# Patient Record
Sex: Male | Born: 1955 | Race: White | Hispanic: No | Marital: Married | Smoking: Never smoker
Health system: Southern US, Community
[De-identification: ages and names within clinical notes are randomized; demographics above are authoritative.]

## PROBLEM LIST (undated history)

## (undated) DIAGNOSIS — H269 Unspecified cataract: Secondary | ICD-10-CM

## (undated) DIAGNOSIS — E079 Disorder of thyroid, unspecified: Secondary | ICD-10-CM

## (undated) DIAGNOSIS — K5790 Diverticulosis of intestine, part unspecified, without perforation or abscess without bleeding: Secondary | ICD-10-CM

## (undated) DIAGNOSIS — E785 Hyperlipidemia, unspecified: Secondary | ICD-10-CM

## (undated) DIAGNOSIS — G4730 Sleep apnea, unspecified: Secondary | ICD-10-CM

## (undated) DIAGNOSIS — I10 Essential (primary) hypertension: Secondary | ICD-10-CM

## (undated) DIAGNOSIS — K589 Irritable bowel syndrome without diarrhea: Secondary | ICD-10-CM

## (undated) DIAGNOSIS — M1990 Unspecified osteoarthritis, unspecified site: Secondary | ICD-10-CM

## (undated) DIAGNOSIS — G709 Myoneural disorder, unspecified: Secondary | ICD-10-CM

## (undated) DIAGNOSIS — Z800 Family history of malignant neoplasm of digestive organs: Secondary | ICD-10-CM

## (undated) DIAGNOSIS — K219 Gastro-esophageal reflux disease without esophagitis: Secondary | ICD-10-CM

## (undated) DIAGNOSIS — E669 Obesity, unspecified: Secondary | ICD-10-CM

## (undated) DIAGNOSIS — T7840XA Allergy, unspecified, initial encounter: Secondary | ICD-10-CM

## (undated) DIAGNOSIS — E039 Hypothyroidism, unspecified: Secondary | ICD-10-CM

## (undated) DIAGNOSIS — Z86010 Personal history of colonic polyps: Secondary | ICD-10-CM

## (undated) DIAGNOSIS — K5730 Diverticulosis of large intestine without perforation or abscess without bleeding: Secondary | ICD-10-CM

## (undated) HISTORY — DX: Personal history of colonic polyps: Z86.010

## (undated) HISTORY — DX: Allergy, unspecified, initial encounter: T78.40XA

## (undated) HISTORY — DX: Gastro-esophageal reflux disease without esophagitis: K21.9

## (undated) HISTORY — DX: Obesity, unspecified: E66.9

## (undated) HISTORY — PX: CATARACT EXTRACTION: SUR2

## (undated) HISTORY — DX: Hyperlipidemia, unspecified: E78.5

## (undated) HISTORY — DX: Disorder of thyroid, unspecified: E07.9

## (undated) HISTORY — DX: Myoneural disorder, unspecified: G70.9

## (undated) HISTORY — DX: Diverticulosis of large intestine without perforation or abscess without bleeding: K57.30

## (undated) HISTORY — DX: Diverticulosis of intestine, part unspecified, without perforation or abscess without bleeding: K57.90

## (undated) HISTORY — DX: Essential (primary) hypertension: I10

## (undated) HISTORY — DX: Unspecified cataract: H26.9

## (undated) HISTORY — PX: REPLACEMENT TOTAL KNEE: SUR1224

## (undated) HISTORY — PX: HEMORROIDECTOMY: SUR656

## (undated) HISTORY — PX: SIGMOID RESECTION / RECTOPEXY: SUR1294

## (undated) HISTORY — DX: Unspecified osteoarthritis, unspecified site: M19.90

## (undated) HISTORY — DX: Family history of malignant neoplasm of digestive organs: Z80.0

## (undated) HISTORY — DX: Sleep apnea, unspecified: G47.30

---

## 1998-05-15 ENCOUNTER — Ambulatory Visit (HOSPITAL_COMMUNITY): Admission: RE | Admit: 1998-05-15 | Discharge: 1998-05-15 | Payer: Self-pay | Admitting: Cardiology

## 1998-08-25 ENCOUNTER — Ambulatory Visit (HOSPITAL_COMMUNITY): Admission: RE | Admit: 1998-08-25 | Discharge: 1998-08-25 | Payer: Self-pay | Admitting: Gastroenterology

## 1998-10-01 ENCOUNTER — Ambulatory Visit (HOSPITAL_COMMUNITY): Admission: RE | Admit: 1998-10-01 | Discharge: 1998-10-01 | Payer: Self-pay | Admitting: Gastroenterology

## 1998-10-01 ENCOUNTER — Encounter: Payer: Self-pay | Admitting: Gastroenterology

## 1998-12-06 HISTORY — PX: CHOLECYSTECTOMY: SHX55

## 1999-12-14 ENCOUNTER — Other Ambulatory Visit: Admission: RE | Admit: 1999-12-14 | Discharge: 1999-12-14 | Payer: Self-pay | Admitting: Gastroenterology

## 2002-04-21 ENCOUNTER — Emergency Department (HOSPITAL_COMMUNITY): Admission: EM | Admit: 2002-04-21 | Discharge: 2002-04-21 | Payer: Self-pay | Admitting: Emergency Medicine

## 2002-04-21 ENCOUNTER — Encounter: Payer: Self-pay | Admitting: Emergency Medicine

## 2002-04-27 ENCOUNTER — Encounter: Payer: Self-pay | Admitting: General Surgery

## 2002-05-04 ENCOUNTER — Encounter (INDEPENDENT_AMBULATORY_CARE_PROVIDER_SITE_OTHER): Payer: Self-pay | Admitting: Specialist

## 2002-05-04 ENCOUNTER — Encounter: Payer: Self-pay | Admitting: General Surgery

## 2002-05-04 ENCOUNTER — Observation Stay (HOSPITAL_COMMUNITY): Admission: RE | Admit: 2002-05-04 | Discharge: 2002-05-05 | Payer: Self-pay | Admitting: General Surgery

## 2003-12-07 HISTORY — PX: NASAL SINUS SURGERY: SHX719

## 2005-03-01 ENCOUNTER — Encounter: Admission: RE | Admit: 2005-03-01 | Discharge: 2005-03-01 | Payer: Self-pay | Admitting: Pediatrics

## 2009-01-15 ENCOUNTER — Encounter (INDEPENDENT_AMBULATORY_CARE_PROVIDER_SITE_OTHER): Payer: Self-pay | Admitting: *Deleted

## 2010-04-08 ENCOUNTER — Telehealth: Payer: Self-pay | Admitting: Gastroenterology

## 2010-06-22 ENCOUNTER — Encounter (INDEPENDENT_AMBULATORY_CARE_PROVIDER_SITE_OTHER): Payer: Self-pay | Admitting: *Deleted

## 2010-06-24 ENCOUNTER — Ambulatory Visit: Payer: Self-pay | Admitting: Gastroenterology

## 2010-06-29 ENCOUNTER — Ambulatory Visit: Payer: Self-pay | Admitting: Gastroenterology

## 2010-06-29 DIAGNOSIS — Z860100 Personal history of colon polyps, unspecified: Secondary | ICD-10-CM

## 2010-06-29 HISTORY — DX: Personal history of colon polyps, unspecified: Z86.0100

## 2010-06-29 HISTORY — DX: Personal history of colonic polyps: Z86.010

## 2010-07-01 ENCOUNTER — Encounter: Payer: Self-pay | Admitting: Gastroenterology

## 2011-01-05 NOTE — Letter (Signed)
Summary: [facility] Instructions  [facility]  [address] [place], [place] [address]   Phone: [phone]  Fax: [phone]       [name] [name]    1956-02-10    MRN: [id number]        Procedure Day /Date:  Monday 06/29/2010     Arrival Time: 7:30 am      Procedure Time:  8:30 am     Location of Procedure:                    _ x_  [facility] (4th Floor)                        PREPARATION FOR COLONOSCOPY WITH MOVIPREP   Starting 5 days prior to your procedure Wednesday 7/20 do not eat nuts, seeds, popcorn, corn, beans, peas,  salads, or any raw vegetables.  Do not take any fiber supplements (e.g. Metamucil, Citrucel, and Benefiber).  THE DAY BEFORE YOUR PROCEDURE         DATE: Sunday 7/24  1.  Drink clear liquids the entire day-NO SOLID FOOD  2.  Do not drink anything colored red or purple.  Avoid juices with pulp.  No orange juice.  3.  Drink at least 64 oz. (8 glasses) of fluid/clear liquids during the day to prevent dehydration and help the prep work efficiently.  CLEAR LIQUIDS INCLUDE: Water Jello Ice Popsicles Tea (sugar ok, no milk/cream) Powdered fruit flavored drinks Coffee (sugar ok, no milk/cream) Gatorade Juice: apple, white grape, white cranberry  Lemonade Clear bullion, consomm, broth Carbonated beverages (any kind) Strained chicken noodle soup Hard Candy                             4.  In the morning, mix first dose of MoviPrep solution:    Empty 1 Pouch A and 1 Pouch B into the disposable container    Add lukewarm drinking water to the top line of the container. Mix to dissolve    Refrigerate (mixed solution should be used within 24 hrs)  5.  Begin drinking the prep at 5:00 p.m. The MoviPrep container is divided by 4 marks.   Every 15 minutes drink the solution down to the next mark (approximately 8 oz) until the full liter is complete.   6.  Follow completed prep with 16 oz of clear liquid of your choice  (Nothing red or purple).  Continue to drink clear liquids until bedtime.  7.  Before going to bed, mix second dose of MoviPrep solution:    Empty 1 Pouch A and 1 Pouch B into the disposable container    Add lukewarm drinking water to the top line of the container. Mix to dissolve    Refrigerate  THE DAY OF YOUR PROCEDURE      DATE: Monday 7/25  Beginning at 3:30 a.m. (5 hours before procedure):         1. Every 15 minutes, drink the solution down to the next mark (approx 8 oz) until the full liter is complete.  2. Follow completed prep with 16 oz. of clear liquid of your choice.    3. You may drink clear liquids until 6:30 am (2 HOURS BEFORE PROCEDURE).   MEDICATION INSTRUCTIONS  Unless otherwise instructed, you should take regular prescription medications with a small sip of water   as early as possible the morning  of your procedure.   Additional medication instructions: Do not takeTriametrene/HCTZ day of procedure.         OTHER INSTRUCTIONS  You will need a responsible adult at least 57 years of age to accompany you and drive you home.   This person must remain in the waiting room during your procedure.  Wear loose fitting clothing that is easily removed.  Leave jewelry and other valuables at home.  However, you may wish to bring a book to read or  an iPod/MP3 player to listen to music as you wait for your procedure to start.  Remove all body piercing jewelry and leave at home.  Total time from sign-in until discharge is approximately 2-3 hours.  You should go home directly after your procedure and rest.  You can resume normal activities the  day after your procedure.  The day of your procedure you should not:   Drive   Make legal decisions   Operate machinery   Drink alcohol   Return to work  You will receive specific instructions about eating, activities and medications before you leave.    The above instructions have been reviewed and explained  to me by   Ezra Sites RN  June 24, 2010 5:03 PM    I fully understand and can verbalize these instructions _____________________________ Date _________

## 2011-01-05 NOTE — Letter (Signed)
Summary: Patient Notice- Polyp Results  [facility]  [address] [place], [place] [address]   Phone: [phone]  Fax: [phone]        July 01, 2010 MRN: [id number]    [name] [name] [address] [place], [place]  [address]    Dear [name],  I am pleased to inform you that the colon polyp(s) removed during your recent colonoscopy was (were) found to be benign (no cancer detected) upon pathologic examination.  I recommend you have a repeat colonoscopy examination in 5_ years to look for recurrent polyps, as having colon polyps increases your risk for having recurrent polyps or even colon cancer in the future.  Should you develop new or worsening symptoms of abdominal pain, bowel habit changes or bleeding from the rectum or bowels, please schedule an evaluation with either your primary care physician or with me.  Additional information/recommendations:  _x_ No further action with gastroenterology is needed at this time. Please      follow-up with your primary care physician for your other healthcare      needs.  __ Please call [phone] to schedule a return visit to review your      situation.  __ Please keep your follow-up visit as already scheduled.  __ Continue treatment plan as outlined the day of your exam.  Please call us if you are having persistent problems or have questions about your condition that have not been fully answered at this time.  Sincerely,  Mardella Layman MD [facility]  This letter has been electronically signed by your physician.  Appended Document: Patient Notice- Polyp Results letter mailed

## 2011-01-05 NOTE — Procedures (Signed)
Summary: Colonoscopy  Patient: [name] [name] Note: All result statuses are Final unless otherwise noted.  Tests: (1) Colonoscopy (COL)   COL Colonoscopy           DONE     [facility]     [address]     [place], [place]  [address]           COLONOSCOPY PROCEDURE REPORT           PATIENT:  [name] [name], [name] [name]  MR#:  [id number]     BIRTHDATE:  [birth year], 54 yrs. old  GENDER:  male     ENDOSCOPIST:  Vania Rea. Jarold Motto, MD, [facility]     REF. BY:     PROCEDURE DATE:  06/29/2010     PROCEDURE:  Colonoscopy with snare polypectomy     ASA CLASS:  Class II     INDICATIONS:  history of pre-cancerous (adenomatous) colon polyps,     family history of colon cancer     MEDICATIONS:   Fentanyl 25 mcg IV, Versed 5 mg IV           DESCRIPTION OF PROCEDURE:   After the risks benefits and     alternatives of the procedure were thoroughly explained, informed     consent was obtained.  Digital rectal exam was performed and     revealed no abnormalities.   The LB CF-H180AL [id number] endoscope     was introduced through the anus and advanced to the cecum, which     was identified by both the appendix and ileocecal valve, without     limitations.  The quality of the prep was excellent, using     MoviPrep.  The instrument was then slowly withdrawn as the colon     was fully examined.     <<PROCEDUREIMAGES>>           FINDINGS:  There was a surgical anastomosis. prior sigmoid     resection.see pictures  Severe diverticulosis was found in the     left colon.  There were multiple polyps identified and removed. in     the rectum and sigmoid colon. flai 2-6 mm polyps hot snare     excised.  This was otherwise a normal examination of the colon.     Retroflexed views in the rectum revealed no abnormalities.    The     scope was then withdrawn from the patient and the procedure     completed.           COMPLICATIONS:  None     ENDOSCOPIC IMPRESSION:     1) Anastomosis     2) Severe diverticulosis  in the left colon     3) Polyps, multiple in the rectum and sigmoid colon     4) Otherwise normal examination     RECOMMENDATIONS:     1) high fiber diet     REPEAT EXAM:  No           ______________________________     Vania Rea. Jarold Motto, MD, [facility]           CC:           n.     eSIGNED:   Vania Rea. Patterson at 06/29/2010 09:05 AM           Regan Rakers, [phone]  Note: An exclamation mark (!) indicates a result that was not dispersed into the flowsheet. Document Creation Date: 06/29/2010 9:05 AM _______________________________________________________________________  (1) Order result  status: Final Collection or observation date-time: 06/29/2010 08:57 Requested date-time:  Receipt date-time:  Reported date-time:  Referring Physician:   Ordering Physician: Sheryn Bison [phone]) Specimen Source:  Source: Launa Grill Order Number: [id number] Lab site:   Appended Document: Colonoscopy     Procedures Next Due Date:    Colonoscopy: 06/2015

## 2011-01-05 NOTE — Progress Notes (Signed)
Summary: Pt Relocated  Phone Note Outgoing Call Call back at [facility] Phone [phone]   Call placed by: Harlow Mares CMA Duncan Dull),  Apr 08, 2010 9:09 AM Call placed to: Patient Summary of Call: patient is due for his colonoscopy and he is aware he has moved and will get his new GI MD to do his colonoscopy Initial call taken by: Harlow Mares CMA Duncan Dull),  Apr 08, 2010 9:11 AM     Appended Document: Pt Relocated pt never moved and never seen another GI MD

## 2011-01-05 NOTE — Miscellaneous (Signed)
Summary: [facility]  Clinical Lists Changes  Medications: Added new medication of MOVIPREP 100 GM  SOLR (PEG-KCL-NACL-NASULF-NA ASC-C) As per prep instructions. - Signed Rx of MOVIPREP 100 GM  SOLR (PEG-KCL-NACL-NASULF-NA ASC-C) As per prep instructions.;  #1 x 0;  Signed;  Entered by: Ezra Sites RN;  Authorized by: Mardella Layman MD [facility];  Method used: [facility]  [phone] [place], [place], [place]  [address], Ph: [phone] or [phone], Fax: [phone] Allergies: Added new allergy or adverse reaction of DOXYCYCLINE Observations: Added new observation of NKA: F (06/24/2010 16:17)    Prescriptions: MOVIPREP 100 GM  SOLR (PEG-KCL-NACL-NASULF-NA ASC-C) As per prep instructions.  #1 x 0   Entered by:   Ezra Sites RN   Authorized by:   Mardella Layman MD [facility]   Signed by:   Ezra Sites RN on 06/24/2010   Method used:   [facility]  [address] [phone][facility] ([facility]ail)       [address]       [place], [place]  [address]       Ph: [phone] or [phone]       Fax: [phone]   RxID:   [id number]

## 2011-04-23 NOTE — Op Note (Signed)
[facility]  Patient:    [name] [name], [name] [name] Visit Number: [id number] MRN: [id number]          Service Type: SUR Location: 4W [id number] 02 Attending Physician:  Tempie Donning Dictated by:   Gita Kudo, M.D. Proc. Date: 05/04/02 Admit Date:  05/04/2002                             Operative Report  OPERATIVE PROCEDURE:  Laparoscopic cholecystectomy with intraoperative cholangiogram.  SURGEON:  Gita Kudo, M.D.  ANESTHESIA:  General endotracheal.  PREOPERATIVE DIAGNOSIS:  Symptomatic gallstones.  POSTOPERATIVE DIAGNOSIS:  Symptomatic gallstones - cholecystitis, normal-appearing cholangiogram.  CLINICAL SUMMARY:  A 55 year old male with symptomatic gallstones presented with abdominal pain, and ultrasound revealed multiple stones.  His liver function studies are normal.  OPERATIVE FINDINGS:  The patient had multiple adhesions in his lower abdomen from his previous sigmoid resection for diverticular disease many years ago by myself.  His gallbladder was thin-walled and small and had multiple small stones.  The cystic duct looked somewhat tortuous on the cholangiogram, and the common duct was long and emptied easily into the duodenum.  OPERATIVE PROCEDURE:  Under satisfactory general endotracheal anesthesia, having received 1.0 g Ancef preop, the patients abdomen was prepped and draped in a standard fashion.  A transverse incision made above the umbilicus and the midline opened into the peritoneum.  Controlled with a figure-of-eight 0 Vicryl suture and operating Hasson port inserted and good CO2 pneumoperitoneum established.  Then the camera was placed and, under direct vision, through Marcaine-infiltrated skin incisions, two #5 ports placed laterally and a second #10 port medially.  Operating through the medial port, with graspers in the lateral port giving good exposure, I dissected the cystic duct gallbladder junction.  Actually at  the junction, the proximal cystic duct tore, and I used this for the insertion of the cholangiogram catheter.  This was placed through a lateral Angiocath and into the duct, then controlled with a clip.  X-rays taken showed a fair amount of tortuous cystic duct distal to the catheter site and the normal-appearing biliary tree.  Then the catheter was drawn and the cystic duct controlled with multiple clips and divided.  Likewise, the cystic artery was identified, circumferentially dissected and when sure of the anatomy, controlled with clips and divided. The gallbladder was then removed from below upward using coagulating spatula for hemostasis and dissection.  Because of the tear in the gallbladder, an EndoCatch bag was placed into the abdomen and the gallbladder placed in it, and it was secured.  The operative site was checked for hemostasis which was good, lavaged with saline, and suctioned dry.  Camera moved to the upper port, and I visually inspected the lower abdomen, the site of his previous surgery.  There was no evidence of any acute disease, and he had multiple adhesions that I did not take down since they were asymptomatic.  Then the gallbladder was removed through the umbilical port. The operative site again checked for hemostasis and lavaged with saline and the returns clear and suctioned dry.  CO2 then released.  Midline closed with the previous figure-of-eight as well as a second interrupted 0 Vicryl suture. This also infiltrated with Marcaine and then all skin sites closed with 4-0 Vicryl subcu and Steri-Strips for skin.  Sterile absorbent dressings applied, and the patient went to the recovery room from the operating room in good condition without complication. Dictated  by:   Gita Kudo, M.D. Attending Physician:  Tempie Donning DD:  05/04/02 TD:  05/05/02 Job: [id number]

## 2011-11-15 ENCOUNTER — Telehealth: Payer: Self-pay | Admitting: Gastroenterology

## 2011-11-16 ENCOUNTER — Encounter: Payer: Self-pay | Admitting: Gastroenterology

## 2011-11-16 MED ORDER — METRONIDAZOLE 500 MG PO TABS: ORAL_TABLET | ORAL | Status: DC

## 2011-11-16 MED ORDER — CIPROFLOXACIN HCL 500 MG PO TABS: ORAL_TABLET | ORAL | Status: DC

## 2011-11-16 NOTE — Telephone Encounter (Signed)
Pt with hx of Diverticulitis with last COLON on 06/29/2010 with benign polyps removed; pt with Sigmoid resection in past d//t diverticulitis. Pt reports left side pain x 3 weeks that's gradually getting worse. He is constipated- denies diarrhea. In the past Doxycycline worked, but he can't take it anymore d/t hives. Pt reports Cipro has also worked in the past. [facility]. Dr Jarold Motto, can you order something or does pt need to come in? Thanks.

## 2011-11-16 NOTE — Telephone Encounter (Signed)
Cipro Flagyl is okay but he does need office followup.

## 2011-11-16 NOTE — Telephone Encounter (Signed)
Error

## 2011-11-16 NOTE — Telephone Encounter (Signed)
Cipro and Flagyl is okay for 10 days but he needs office followup

## 2011-11-16 NOTE — Telephone Encounter (Signed)
Notified pt we ordered Cipro and Flagyl and he needs a f/u appt. Pt scheduled for 12/17/11 at 10am. Appt reminder sent.

## 2011-12-14 ENCOUNTER — Ambulatory Visit: Payer: Self-pay | Admitting: Gastroenterology

## 2011-12-16 ENCOUNTER — Encounter: Payer: Self-pay | Admitting: *Deleted

## 2011-12-17 ENCOUNTER — Ambulatory Visit (INDEPENDENT_AMBULATORY_CARE_PROVIDER_SITE_OTHER): Payer: Managed Care, Other (non HMO) | Admitting: Gastroenterology

## 2011-12-17 ENCOUNTER — Telehealth: Payer: Self-pay | Admitting: *Deleted

## 2011-12-17 ENCOUNTER — Other Ambulatory Visit (INDEPENDENT_AMBULATORY_CARE_PROVIDER_SITE_OTHER): Payer: Managed Care, Other (non HMO)

## 2011-12-17 ENCOUNTER — Encounter: Payer: Self-pay | Admitting: Gastroenterology

## 2011-12-17 DIAGNOSIS — R109 Unspecified abdominal pain: Secondary | ICD-10-CM

## 2011-12-17 DIAGNOSIS — K5732 Diverticulitis of large intestine without perforation or abscess without bleeding: Secondary | ICD-10-CM

## 2011-12-17 DIAGNOSIS — K5792 Diverticulitis of intestine, part unspecified, without perforation or abscess without bleeding: Secondary | ICD-10-CM | POA: Insufficient documentation

## 2011-12-17 DIAGNOSIS — K219 Gastro-esophageal reflux disease without esophagitis: Secondary | ICD-10-CM

## 2011-12-17 DIAGNOSIS — E669 Obesity, unspecified: Secondary | ICD-10-CM

## 2011-12-17 DIAGNOSIS — Z800 Family history of malignant neoplasm of digestive organs: Secondary | ICD-10-CM

## 2011-12-17 LAB — BASIC METABOLIC PANEL
CO2: 30 mEq/L (ref 19–32)
Calcium: 9.5 mg/dL (ref 8.4–10.5)
Chloride: 101 mEq/L (ref 96–112)
Glucose, Bld: 94 mg/dL (ref 70–99)
Potassium: 4 mEq/L (ref 3.5–5.1)
Sodium: 140 mEq/L (ref 135–145)

## 2011-12-17 LAB — CBC WITH DIFFERENTIAL/PLATELET
Basophils Absolute: 0 10*3/uL (ref 0.0–0.1)
HCT: 47.3 % (ref 39.0–52.0)
Lymphocytes Relative: 14.8 % (ref 12.0–46.0)
Lymphs Abs: 1.4 10*3/uL (ref 0.7–4.0)
Monocytes Relative: 5.8 % (ref 3.0–12.0)
Platelets: 150 10*3/uL (ref 150.0–400.0)
RDW: 14.7 % — ABNORMAL HIGH (ref 11.5–14.6)

## 2011-12-17 LAB — HEPATIC FUNCTION PANEL
AST: 19 U/L (ref 0–37)
Albumin: 4.2 g/dL (ref 3.5–5.2)
Alkaline Phosphatase: 79 U/L (ref 39–117)
Total Protein: 6.9 g/dL (ref 6.0–8.3)

## 2011-12-17 LAB — FERRITIN: Ferritin: 48.5 ng/mL (ref 22.0–322.0)

## 2011-12-17 LAB — IBC PANEL: Iron: 82 ug/dL (ref 42–165)

## 2011-12-17 MED ORDER — FOLIC ACID 1 MG PO TABS: 1.0000 mg | ORAL_TABLET | Freq: Every day | ORAL | Status: AC

## 2011-12-17 NOTE — Progress Notes (Signed)
History of Present Illness:  This is a 56 year old Caucasian male status post partial colectomy 10 years ago because of recurrent diverticulitis. He now describes 4 weeks of dull left lower quadrant discomfort with incomplete rectal emptying but no rectal bleeding or melena, fever, chills, upper GI or hepatobiliary complaints. We treated him with 10 days of Cipro And Metronidazole without improvement 2 weeks ago. The patient denies any food intolerances, new medications, or systemic symptomatology. Last colonoscopy was in July 2011 and showed severe left colon diverticulosis, and multiple hyperplastic polyps were removed. Patient's had a 30 pound weight loss over the last year related to a low carbohydrate diet. He's had previous cholecystectomy,and previous hemorrhoid surgery by Dr. Jerelene Redden. Patient denies abuse of alcohol or cigarettes, and there is no history of previous hepatitis or pancreatitis. 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 Soma aspirin 81 mg a day. His father died from colon cancer at age 52. Patient had endoscopy and dilatation of an esophageal stricture several years ago, and denies dysphagia since that time.  I have reviewed this patient's present history, medical and surgical past history, allergies and medications.     ROS: The remainder of the 10 point ROS is negative  Allergies  Allergen Reactions  . Doxycycline     REACTION: hives   Outpatient Prescriptions Prior to Visit  Medication Sig Dispense Refill  . ciprofloxacin (CIPRO) 500 MG tablet Take 1 tablet by mouth twice daily for 10 days.  20 tablet  0  . metroNIDAZOLE (FLAGYL) 500 MG tablet Take 1 tablet by mouth twice daily for 10 days.  20 tablet  0   Past Medical History  Diagnosis Date  . Diverticulosis of colon (without mention of hemorrhage)   . Personal history of colonic polyps  06/29/2010    hyperplastic  . Family history of malignant neoplasm of gastrointestinal tract   . Esophageal reflux   . Hypertension   . Thyroid disease   . Hyperlipemia   . Obesity   . Sleep apnea    Past Surgical History  Procedure Date  . Cholecystectomy   . Sigmoid resection / rectopexy   . Hemorroidectomy    History   Social History  . Marital Status: Married    Spouse Name: N/A    Number of Children: N/A  . Years of Education: N/A   Social History Main Topics  . Smoking status: Never Smoker   . Smokeless tobacco: Never Used  . Alcohol Use: Yes     occ   . Drug Use: No  . Sexually Active: None   Other Topics Concern  . None   Social History Narrative   Occ caffeine    Family History  Problem Relation Age of Onset  . Colon cancer Father 90        Physical Exam: General well developed well nourished patient in no acute distress, appearing his stated age, obese with a BMI 41.6. Blood pressure today 138/80. Eyes PERRLA, no icterus, fundoscopic exam per opthamologist Skin no lesions noted Neck supple, no adenopathy, no thyroid enlargement, no tenderness Chest clear to percussion and auscultation Heart no significant murmurs, gallops or rubs noted Abdomen no hepatosplenomegaly masses or tenderness, BS normal.  Rectal inspection normal no fissures, or fistulae noted.  No masses or tenderness on digital exam. Stool guaiac negative. There is mild anal stenosis noted, but  no fistulae or fissures. Extremities no acute joint lesions, edema, phlebitis or evidence of cellulitis. Neurologic patient oriented x 3, cranial nerves intact, no focal neurologic deficits noted. Psychological mental status normal and normal affect.  Assessment and plan: Subacute diverticulitis, rule out segmental colitis, pericolonic abscess, colonic fistulization, et Karie Soda. I have scheduled him for CT scan of his abdomen and pelvis and order laboratory review. He is to continue his high-fiber  diet and daily Metamucil. He does not feel that he needs any analgesics at this time. His GERD seems to be doing well on daily Prilosec we will continue. Otherwise he is to continue all medications as listed and reviewed in his record.  Encounter Diagnoses  Name Primary?  . Diverticulitis   . Abdominal pain

## 2011-12-17 NOTE — Patient Instructions (Signed)
Please go to the basement today for your labs.    You have been scheduled for a CT scan of the abdomen and pelvis at [facility] ([address]---this is in the same building as Architectural technologist).   You are scheduled on 12/20/2011 at 9:00am. You should arrive 15 minutes prior to your appointment time for registration. Please follow the written instructions below on the day of your exam:  WARNING: IF YOU ARE ALLERGIC TO IODINE/X-RAY DYE, PLEASE NOTIFY RADIOLOGY IMMEDIATELY AT [phone]! YOU WILL BE GIVEN A 13 HOUR PREMEDICATION PREP.  1) Do not eat or drink anything after 5:00am (4 hours prior to your test) 2) You have been given 2 bottles of oral contrast to drink. The solution may taste better if refrigerated, but do NOT add ice or any other liquid to this solution. Shake well before drinking.    Drink 1 bottle of contrast @ 7:00am (2 hours prior to your exam)  Drink 1 bottle of contrast @ 8:00am (1 hour prior to your exam)  You may take any medications as prescribed with a small amount of water except for the following: Metformin, Glucophage, Glucovance, Avandamet, Riomet, Fortamet, Actoplus Met, Janumet, Glumetza or Metaglip. The above medications must be held the day of the exam AND 48 hours after the exam.  The purpose of you drinking the oral contrast is to aid in the visualization of your intestinal tract. The contrast solution may cause some diarrhea. Before your exam is started, you will be given a small amount of fluid to drink. Depending on your individual set of symptoms, you may also receive an intravenous injection of x-ray contrast/dye. Plan on being at [facility] for 30 minutes or long, depending on the type of exam you are having performed.  If you have any questions regarding your exam or if you need to reschedule, you may call the CT department at [phone] between the hours of 8:00 am and 5:00 pm,  Monday-Friday.  ________________________________________________________________________

## 2011-12-17 NOTE — Telephone Encounter (Signed)
Message copied by Leonette Monarch on Fri Dec 17, 2011  3:20 PM ------      Message from: PATTERSON, DAVID R      Created: Fri Dec 17, 2011  3:00 PM       FOLIC ACID 1 MG/DAY

## 2011-12-17 NOTE — Telephone Encounter (Signed)
Pt aware, rx sent/lk

## 2011-12-20 ENCOUNTER — Ambulatory Visit (INDEPENDENT_AMBULATORY_CARE_PROVIDER_SITE_OTHER)
Admission: RE | Admit: 2011-12-20 | Discharge: 2011-12-20 | Disposition: A | Discharge status: Home / Self Care | Payer: Managed Care, Other (non HMO) | Source: Ambulatory Visit | Attending: Gastroenterology | Admitting: Gastroenterology

## 2011-12-20 ENCOUNTER — Encounter: Payer: Self-pay | Admitting: Gastroenterology

## 2011-12-20 DIAGNOSIS — R109 Unspecified abdominal pain: Secondary | ICD-10-CM

## 2011-12-20 DIAGNOSIS — K5792 Diverticulitis of intestine, part unspecified, without perforation or abscess without bleeding: Secondary | ICD-10-CM

## 2011-12-20 DIAGNOSIS — K5732 Diverticulitis of large intestine without perforation or abscess without bleeding: Secondary | ICD-10-CM

## 2011-12-20 MED ORDER — IOHEXOL 300 MG/ML  SOLN
100.0000 mL | Freq: Once | INTRAMUSCULAR | Status: AC | PRN
  Administered 2011-12-20: 100 mL via INTRAVENOUS

## 2011-12-21 ENCOUNTER — Telehealth: Payer: Self-pay | Admitting: *Deleted

## 2011-12-21 NOTE — Telephone Encounter (Signed)
Informed pt of abnormal CT scan and the need for f/u with a Pulmonologist; pt has no preference. Pt scheduled with Dr Shelle Iron, 01/10/12 at 2:30pm; pt stated understanding.

## 2011-12-21 NOTE — Telephone Encounter (Signed)
Message copied by Florene Glen on Tue Dec 21, 2011  9:44 AM ------      Message from: [place], DAVID R      Created: Tue Dec 21, 2011  8:35 AM       He does not have acute diverticulitis. He needs pulmonary referral per his abnormal chest CT scan. Please arrange this for the patient or have him do this through his primary care physician.

## 2011-12-29 ENCOUNTER — Ambulatory Visit (INDEPENDENT_AMBULATORY_CARE_PROVIDER_SITE_OTHER)
Admission: RE | Admit: 2011-12-29 | Discharge: 2011-12-29 | Disposition: A | Discharge status: Home / Self Care | Payer: Managed Care, Other (non HMO) | Source: Ambulatory Visit | Attending: Internal Medicine | Admitting: Internal Medicine

## 2011-12-29 ENCOUNTER — Ambulatory Visit (INDEPENDENT_AMBULATORY_CARE_PROVIDER_SITE_OTHER): Payer: Managed Care, Other (non HMO) | Admitting: Internal Medicine

## 2011-12-29 ENCOUNTER — Encounter: Payer: Self-pay | Admitting: Internal Medicine

## 2011-12-29 VITALS — BP 142/86 | HR 80 | Temp 97.8°F | Ht 70.0 in | Wt 295.0 lb

## 2011-12-29 DIAGNOSIS — R0609 Other forms of dyspnea: Secondary | ICD-10-CM

## 2011-12-29 DIAGNOSIS — R0989 Other specified symptoms and signs involving the circulatory and respiratory systems: Secondary | ICD-10-CM

## 2011-12-29 DIAGNOSIS — R0600 Dyspnea, unspecified: Secondary | ICD-10-CM | POA: Insufficient documentation

## 2011-12-29 DIAGNOSIS — R918 Other nonspecific abnormal finding of lung field: Secondary | ICD-10-CM

## 2011-12-29 NOTE — Progress Notes (Signed)
  Subjective:    Patient ID: [name] [name], male    DOB: [birth year], 55 y.o.   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 lipitor with neg muscle bx and better p rx with prednisone taper to 10 mg per day self titrating with canadian source and better x for breathing which seems worse since  lost 30 lb   but note sob only occurs at rest, not with exertion including yardwork. Not aerobically active. No cough  Sleeping ok on CPAP per [facility]  without nocturnal  or early am exacerbation  of respiratory  c/o's or need for noct saba. Also denies any obvious fluctuation of symptoms with weather or environmental changes or other aggravating or alleviating factors except as outlined above.   New chest pain x 2 months episodic @ L mid axillary line and  bottom of rib, better p bowel movement, typically not while sleeping. No hematemesis or hematochezia or unintended wt loss.     Review of Systems  Constitutional: Positive for appetite change and unexpected weight change. Negative for fever, chills and activity change.  HENT: Negative for congestion, sore throat, rhinorrhea, sneezing, trouble swallowing, dental problem, voice change and postnasal drip.   Eyes: Negative for visual disturbance.  Respiratory: Positive for shortness of breath. Negative for cough and choking.   Cardiovascular: Positive for chest pain and leg swelling.  Gastrointestinal: Positive for abdominal pain. Negative for nausea and vomiting.  Genitourinary: Negative for difficulty urinating.  Musculoskeletal: Positive for arthralgias.  Skin: Negative for rash.  Psychiatric/Behavioral: Negative for behavioral problems and confusion.       Objective:     Physical Exam  Obese pleasant amb wm nad  Wt 295 12/29/2011  HEENT: nl dentition, turbinates, and orophanx. Nl external ear canals without cough reflex   NECK :  without JVD/Nodes/TM/ nl carotid upstrokes bilaterally   LUNGS: no acc muscle use, clear to A and P bilaterally without cough on insp or exp maneuvers   CV:  RRR  no s3 or murmur or increase in P2, no edema   ABD:  soft and nontender with nl excursion in the supine position. No bruits or organomegaly, bowel sounds nl  MS:  warm without deformities, calf tenderness, cyanosis or clubbing  SKIN: warm and dry without lesions    NEURO:  alert, approp, no deficits    Ct abd 12/21/11 Bilateral lower lobe pulmonary nodules are identified,  largest in the right lower lobe on image 10 measuring 4 mm and  largest in the left lower lobe image 11 measuring 5 mm. No pleural  effusion. Heart size is normal.  CXR  12/29/2011 :  Previously identified pulmonary nodules are not confidently identified by plain radiography. Additionally, no larger suspicious appearing pulmonary nodules or masses are identified on today's study.        Assessment & Plan:

## 2011-12-29 NOTE — Assessment & Plan Note (Signed)
Although there are clearly abnormalities on CT scan, they should probably be considered "microscopic" since not obvious on plain cxr .     In the setting of obvious "macroscopic" health issues,  I am very reluctatnt to embark on an invasive w/u at this point but will arrange consevative  follow up and in the meantime see what we can do to address the patient's subjective concerns (sob at rest)

## 2011-12-29 NOTE — Assessment & Plan Note (Addendum)
Very unusual pattern of sob at rest but not exertion. Symptoms are markedly disproportionate to objective findings and not clear this is a lung problem but pt does appear to have difficult airway management issues.   DDX of  difficult airways managment all start with A and  include Adherence, Ace Inhibitors, Acid Reflux, Active Sinus Disease, Alpha 1 Antitripsin deficiency, Anxiety masquerading as Airways dz,  ABPA,  allergy(esp in young), Aspiration (esp in elderly), Adverse effects of DPI,  Active smokers, plus two Bs  = Bronchiectasis and Beta blocker use..and one C= CHF   Anxiety would be the leading suspect given pattern not present sleeping or with exertion, though usually a dx of exclusion  ? Acid reflux/ LPR also a potential concern despite absence of HB  First step is pft's then regroup

## 2011-12-29 NOTE — Patient Instructions (Signed)
Please remember to go to the lab and x-ray department downstairs for your tests - we will call you with the results when they are available. These spots are so small they do not require immediate follow up but we will place your chart in our tickle file for recall in 6 months to regroup re: further w/u at that time     Please schedule a follow up office visit in 6  weeks, sooner if needed with pft's

## 2011-12-30 NOTE — Progress Notes (Signed)
Quick Note:  Spoke with pt and notified of results per Dr. Wert. Pt verbalized understanding and denied any questions.  ______ 

## 2012-01-10 ENCOUNTER — Institutional Professional Consult (permissible substitution): Payer: Managed Care, Other (non HMO) | Admitting: Pulmonary Disease

## 2012-02-21 ENCOUNTER — Ambulatory Visit (INDEPENDENT_AMBULATORY_CARE_PROVIDER_SITE_OTHER): Payer: Managed Care, Other (non HMO) | Admitting: Internal Medicine

## 2012-02-21 ENCOUNTER — Encounter: Payer: Self-pay | Admitting: Internal Medicine

## 2012-02-21 VITALS — BP 130/82 | HR 63 | Temp 97.5°F | Ht 70.0 in | Wt 292.0 lb

## 2012-02-21 DIAGNOSIS — R0609 Other forms of dyspnea: Secondary | ICD-10-CM

## 2012-02-21 DIAGNOSIS — R918 Other nonspecific abnormal finding of lung field: Secondary | ICD-10-CM

## 2012-02-21 DIAGNOSIS — R0600 Dyspnea, unspecified: Secondary | ICD-10-CM

## 2012-02-21 LAB — PULMONARY FUNCTION TEST

## 2012-02-21 NOTE — Progress Notes (Signed)
  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 lipitor with neg muscle bx and better p rx with prednisone taper to 10 mg per day self titrating with canadian source and better x for breathing which seems worse since  lost 30 lb   but note sob only occurs at rest, not with exertion including yardwork. Not aerobically active. No cough  Sleeping ok on CPAP per [facility]  without nocturnal  or early am exacerbation  of respiratory  c/o's or need for noct saba. Also denies any obvious fluctuation of symptoms with weather or environmental changes or other aggravating or alleviating factors except as outlined above.   New chest pain x 2 months episodic @ L mid axillary line and  bottom of rib, better p bowel movement, typically not while sleeping. No hematemesis or hematochezia or unintended wt loss.  Imp was IBS for the cp rec  Check labs > all wnl including esr 12  02/21/12 Brynlea Spindler/ f/u ov no more cp. Not limited by sob but not aerobically very active. No cough  Sleeping ok without nocturnal  or early am exacerbation  of respiratory  c/o's or need for noct saba. Also denies any obvious fluctuation of symptoms with weather or environmental changes or other aggravating or alleviating factors except as outlined above   ROS  At present neg for  any significant sore throat, dysphagia, itching, sneezing,  nasal congestion or excess/ purulent secretions,  fever, chills, sweats, unintended wt loss, pleuritic or exertional cp, hempoptysis, orthopnea pnd or leg swelling.  Also denies presyncope, palpitations, heartburn, abdominal pain, nausea, vomiting, diarrhea  or change  in bowel or urinary habits, dysuria,hematuria,  rash, arthralgias, visual complaints, headache, numbness weakness or ataxia.            Objective:   Physical Exam  Obese pleasant amb wm nad   Wt 295 12/29/2011 > 292 02/21/2012   HEENT: nl dentition, turbinates, and orophanx. Nl external ear canals without cough reflex   NECK :  without JVD/Nodes/TM/ nl carotid upstrokes bilaterally   LUNGS: no acc muscle use, clear to A and P bilaterally without cough on insp or exp maneuvers   CV:  RRR  no s3 or murmur or increase in P2, no edema   ABD:  soft and nontender with nl excursion in the supine position. No bruits or organomegaly, bowel sounds nl  MS:  warm without deformities, calf tenderness, cyanosis or clubbing      Ct abd 12/21/11 Bilateral lower lobe pulmonary nodules are identified,  largest in the right lower lobe on image 10 measuring 4 mm and  largest in the left lower lobe image 11 measuring 5 mm. No pleural  effusion. Heart size is normal.  CXR  12/29/2011 :  Previously identified pulmonary nodules are not confidently identified by plain radiography. Additionally, no larger suspicious appearing pulmonary nodules or masses are identified on today's study.        Assessment & Plan:

## 2012-02-21 NOTE — Assessment & Plan Note (Addendum)
-   onset 2009 present at rest but not exertion   - PFT's  02/21/2012  FEV1  3.03 ( 90%) ratio 80 and DLCO 88 with ERV 61  C/w effects of obesity based on body habitus and disproportionate reduction in ERV  I had an extended discussion with the patient today lasting 15 to 20 minutes of a 25 minute visit on the following issues:  Results of labs/ xrays/ pfts Reviewed cal balance issues  See instructions for specific recommendations which were reviewed directly with the patient who was given a copy with highlighter outlining the key components.

## 2012-02-21 NOTE — Progress Notes (Signed)
PFT done today. 

## 2012-02-21 NOTE — Patient Instructions (Signed)
Weight control is simply a matter of calorie balance which needs to be tilted in your favor by eating less and exercising more.  To get the most out of exercise, you need to be continuously aware that you are short of breath, but never out of breath, for 30 minutes daily. As you improve, it will actually be easier for you to do the same amount of exercise  in  30 minutes so always push to the level where you are short of breath.  If this does not result in gradual weight reduction then I strongly recommend you see a nutritionist with a food diary x 2 weeks so that we can work out a negative calorie balance which is universally effective in steady weight loss programs.  Think of your calorie balance like you do your bank account where in this case you want the balance to go down so you must take in less calories than you burn up.  It's just that simple:  Hard to do, but easy to understand.  Good luck!    

## 2012-02-22 NOTE — Assessment & Plan Note (Signed)
Discussed in detail all the  indications, usual  risks and alternatives  relative to the benefits with patient who agrees to proceed with conservative f/u as per tickle file, will recall for CT Chest 06/19/12 but doubt these nodules will turn out to be anything but benign and note absence so far of any asbestos changes now 30 years out from exposure

## 2012-05-12 ENCOUNTER — Telehealth: Payer: Self-pay | Admitting: *Deleted

## 2012-05-12 DIAGNOSIS — R918 Other nonspecific abnormal finding of lung field: Secondary | ICD-10-CM

## 2012-05-12 NOTE — Telephone Encounter (Signed)
Message copied by Christen Butter on Fri May 12, 2012 11:52 AM ------      Message from: Christen Butter      Created: Wed Dec 29, 2011 10:54 AM       Needs CT Chest no CM due by 06-21-12

## 2012-05-12 NOTE — Telephone Encounter (Signed)
Spoke with pt and notified that he is due in July for ct chest and will go ahead and send order to [facility] to schedule this. Pt verbalized understanding and denied any questions. Order was sent to [facility].

## 2012-06-13 ENCOUNTER — Other Ambulatory Visit: Payer: Self-pay | Admitting: Physical Medicine and Rehabilitation

## 2012-06-19 ENCOUNTER — Encounter: Payer: Self-pay | Admitting: Internal Medicine

## 2012-06-19 ENCOUNTER — Ambulatory Visit
Admission: RE | Admit: 2012-06-19 | Discharge: 2012-06-19 | Disposition: A | Discharge status: Home / Self Care | Payer: Managed Care, Other (non HMO) | Source: Ambulatory Visit | Attending: Internal Medicine | Admitting: Internal Medicine

## 2012-06-19 ENCOUNTER — Other Ambulatory Visit: Payer: Managed Care, Other (non HMO)

## 2012-06-19 DIAGNOSIS — R918 Other nonspecific abnormal finding of lung field: Secondary | ICD-10-CM

## 2012-06-19 NOTE — Progress Notes (Signed)
Quick Note:  Spoke with pt and notified of results per Dr. Wert. Pt verbalized understanding and denied any questions.  ______ 

## 2012-10-06 ENCOUNTER — Other Ambulatory Visit: Payer: Self-pay | Admitting: Dermatology

## 2013-03-26 ENCOUNTER — Encounter: Payer: Self-pay | Admitting: Neurology

## 2013-03-26 ENCOUNTER — Ambulatory Visit (INDEPENDENT_AMBULATORY_CARE_PROVIDER_SITE_OTHER): Payer: Managed Care, Other (non HMO) | Admitting: Neurology

## 2013-03-26 VITALS — BP 139/85 | HR 69 | Ht 70.0 in | Wt 308.0 lb

## 2013-03-26 DIAGNOSIS — M6281 Muscle weakness (generalized): Secondary | ICD-10-CM

## 2013-03-26 DIAGNOSIS — E785 Hyperlipidemia, unspecified: Secondary | ICD-10-CM

## 2013-03-26 DIAGNOSIS — K5730 Diverticulosis of large intestine without perforation or abscess without bleeding: Secondary | ICD-10-CM | POA: Insufficient documentation

## 2013-03-26 DIAGNOSIS — Z86010 Personal history of colonic polyps: Secondary | ICD-10-CM

## 2013-03-26 DIAGNOSIS — I10 Essential (primary) hypertension: Secondary | ICD-10-CM

## 2013-03-26 DIAGNOSIS — K5790 Diverticulosis of intestine, part unspecified, without perforation or abscess without bleeding: Secondary | ICD-10-CM | POA: Insufficient documentation

## 2013-03-26 DIAGNOSIS — G4730 Sleep apnea, unspecified: Secondary | ICD-10-CM

## 2013-03-26 DIAGNOSIS — K219 Gastro-esophageal reflux disease without esophagitis: Secondary | ICD-10-CM

## 2013-03-26 DIAGNOSIS — E079 Disorder of thyroid, unspecified: Secondary | ICD-10-CM | POA: Insufficient documentation

## 2013-03-26 MED ORDER — DULOXETINE HCL 60 MG PO CPEP: 60.0000 mg | ORAL_CAPSULE | Freq: Every day | ORAL | Status: DC

## 2013-03-27 LAB — VITAMIN B12: Vitamin B-12: 570 pg/mL (ref 211–946)

## 2013-03-27 LAB — CBC
HCT: 46.5 % (ref 37.5–51.0)
Platelets: 208 10*3/uL (ref 155–379)
RBC: 5.07 x10E6/uL (ref 4.14–5.80)
RDW: 14.3 % (ref 12.3–15.4)
WBC: 13.1 10*3/uL — ABNORMAL HIGH (ref 3.4–10.8)

## 2013-03-27 LAB — C-REACTIVE PROTEIN: CRP: 6.9 mg/L — ABNORMAL HIGH (ref 0.0–4.9)

## 2013-03-27 LAB — PROTEIN ELECTROPHORESIS, SERUM
A/G Ratio: 1.4 (ref 0.7–2.0)
Albumin ELP: 4.2 g/dL (ref 3.2–5.6)
Alpha 2: 0.7 g/dL (ref 0.4–1.2)
Globulin, Total: 2.9 g/dL (ref 2.0–4.5)

## 2013-03-27 LAB — COMPREHENSIVE METABOLIC PANEL
Albumin/Globulin Ratio: 1.8 (ref 1.1–2.5)
Albumin: 4.6 g/dL (ref 3.5–5.5)
BUN: 14 mg/dL (ref 6–24)
CO2: 28 mmol/L (ref 19–28)
Calcium: 9.4 mg/dL (ref 8.7–10.2)
Creatinine, Ser: 1.06 mg/dL (ref 0.76–1.27)
GFR calc non Af Amer: 77 mL/min/{1.73_m2} (ref >59)
Globulin, Total: 2.5 g/dL (ref 1.5–4.5)

## 2013-03-27 LAB — ANA: Anti Nuclear Antibody(ANA): NEGATIVE

## 2013-03-27 LAB — HIV ANTIBODY (ROUTINE TESTING W REFLEX): HIV-1/HIV-2 Ab: NONREACTIVE

## 2013-03-27 LAB — RPR: RPR: NONREACTIVE

## 2013-03-27 LAB — TSH: TSH: 1.62 u[IU]/mL (ref 0.450–4.500)

## 2013-03-27 LAB — FOLATE: Folate: 17.3 ng/mL (ref >3.0)

## 2013-03-27 NOTE — Progress Notes (Signed)
[name] [name] is a 57 years old right-handed Caucasian male, referred by his primary care physician Dr. 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 Zannie Kehr, I reviewed the report dated March 25th 2011, open muscle biopsy of right vastus lateralis demonstrated nonspecific fiber atrophy, no inflammatory changes noticed,   Laboratory showed a positive ANA 1-640, speckled, otherwise normal or negative CBC, CMP, and lactate acid level, carnitine level, amino acid level,   acylcarnitine level, ESR, B12 was 208, normal TSH, CPK was 83-85, ESR was 10,  He also had MRI of the brain, and spine, there is no significant abnormalities,  He was not offered any treatment, but over the past 3 years, since 2011, he has been ordering prednisone through Internet from [place], initially he was taking 5 mg every day, which does improve his symptoms,  since 2012, he decreased the prednisone to 10 mg every day, now even with 10 mg every day, he remains to be symptomatic, he complains of generalized fatigue, bilateral lower extremity achy pain, subjective weakness, but denies significant gait difficulty, no difficulty raising arm overhead. He denies sensory loss,   He is going through a lot of stress, his wife is dying from  metastatic breast cancer.  Review of Systems  Out of a complete 14 system review, the patient complains of only the following symptoms, and all other reviewed systems are negative.   Constitutional:   Fatigue Cardiovascular: Swelling in his legs  Ear/Nose/Throat:  N/A Skin: N/A Eyes: N/A Respiratory: snoring Gastroitestinal: constipation    Hematology/Lymphatic: easy bruising. Endocrine:  N/A Musculoskeletal: aching muscles Allergy/Immunology: allergies Neurological: Numbness, weakness, snoring Psychiatric:    N/A  PHYSICAL EXAMINATOINS:  Generalized: In no acute distress  Neck: Supple, no carotid bruits   Cardiac: Regular rate rhythm  Pulmonary: Clear to auscultation bilaterally  Musculoskeletal: No deformity  Neurological examination  Mentation: Alert oriented to time, place, history taking, and causual conversation, obese.  Cranial nerve II-XII: Pupils were equal round reactive to light extraocular movements were full, visual field were full on confrontational test. facial sensation and strength were normal. hearing was intact to finger rubbing bilaterally. Uvula tongue midline.  head turning and shoulder shrug and were normal and symmetric.Tongue protrusion into cheek strength was normal.  Motor: normal tone, bulk and strength.  Sensory: Intact to fine touch, pinprick, preserved vibratory sensation, and proprioception at toes.  Coordination: Normal finger to nose, heel-to-shin bilaterally there was no truncal ataxia  Gait: Rising up from seated position without assistance, normal stance, without trunk ataxia, moderate stride, good arm swing, smooth turning, able to perform tiptoe, and heel walking without difficulty.   Romberg signs: Negative  Deep tendon reflexes: Brachioradialis 2/2, biceps 2/2, triceps 2/2, patellar 2/2, Achilles 2/2, plantar responses were flexor bilaterally.  Assessment and plan: 57 year old gentleman, with obesity, previous mild abnormal  muscle biopsy of right vastus lateralis.  No definite etiology was found, he has been on long-term dose of prednisone treatment,  1. I do not think prednisone treatment is a good option without clear inflammatory changes, muscle biopsy  2,  laboratory evaluation today, return for EMG nerve conduction study 3 Cymbalta 60 mg every day.

## 2013-03-28 ENCOUNTER — Encounter: Payer: Managed Care, Other (non HMO) | Admitting: Radiology

## 2013-04-06 ENCOUNTER — Encounter: Payer: Managed Care, Other (non HMO) | Admitting: Radiology

## 2013-04-06 ENCOUNTER — Ambulatory Visit (INDEPENDENT_AMBULATORY_CARE_PROVIDER_SITE_OTHER): Payer: Managed Care, Other (non HMO) | Admitting: Neurology

## 2013-04-06 ENCOUNTER — Encounter (INDEPENDENT_AMBULATORY_CARE_PROVIDER_SITE_OTHER): Payer: Managed Care, Other (non HMO)

## 2013-04-06 DIAGNOSIS — Z86010 Personal history of colonic polyps: Secondary | ICD-10-CM

## 2013-04-06 DIAGNOSIS — K5730 Diverticulosis of large intestine without perforation or abscess without bleeding: Secondary | ICD-10-CM

## 2013-04-06 DIAGNOSIS — G4730 Sleep apnea, unspecified: Secondary | ICD-10-CM

## 2013-04-06 DIAGNOSIS — Z0289 Encounter for other administrative examinations: Secondary | ICD-10-CM

## 2013-04-06 DIAGNOSIS — K219 Gastro-esophageal reflux disease without esophagitis: Secondary | ICD-10-CM

## 2013-04-06 DIAGNOSIS — E079 Disorder of thyroid, unspecified: Secondary | ICD-10-CM

## 2013-04-06 DIAGNOSIS — M629 Disorder of muscle, unspecified: Secondary | ICD-10-CM

## 2013-04-06 DIAGNOSIS — M791 Myalgia, unspecified site: Secondary | ICD-10-CM

## 2013-04-06 DIAGNOSIS — E785 Hyperlipidemia, unspecified: Secondary | ICD-10-CM

## 2013-04-06 DIAGNOSIS — I10 Essential (primary) hypertension: Secondary | ICD-10-CM

## 2013-04-06 DIAGNOSIS — K5790 Diverticulosis of intestine, part unspecified, without perforation or abscess without bleeding: Secondary | ICD-10-CM

## 2013-04-06 NOTE — Procedures (Signed)
History: 57 years old right-handed Caucasian male with diffuse body aching pain  On examination: Bilateral upper and lower extremity motor strength was normal. Deep tendon reflexes were present and symmetric  Nerve conduction studies: Bilateral peroneal sensory responses were normal. Bilateral peroneal to EDB, tibial motor responses were normal. Left median, ulnar sensory and motor responses were normal  Electromyography: Selected needle examination was performed at right lower extremity muscles, and the right upper extremity muscles.  Needle examination of right tibialis anterior, medial gastrocnemius, vastus lateralis was normal. There was no spontaneous activity at right lumbosacral paraspinal muscles, right L4, L5, S1.  Needle examination of right biceps, deltoid, was normal  In conclusion:   This is a normal study, there was no electrodiagnostic evidence of inflammatory myopathy, there is no evidence of large fiber peripheral neuropathy, or  right lumbosacral radiculopathy

## 2013-04-14 ENCOUNTER — Encounter: Payer: Self-pay | Admitting: Neurology

## 2013-04-29 ENCOUNTER — Other Ambulatory Visit: Payer: Self-pay

## 2013-04-29 MED ORDER — DULOXETINE HCL 60 MG PO CPEP: 60.0000 mg | ORAL_CAPSULE | Freq: Every day | ORAL | Status: DC

## 2013-04-29 NOTE — Telephone Encounter (Signed)
90 day Rx requested

## 2013-06-19 ENCOUNTER — Telehealth: Payer: Self-pay | Admitting: *Deleted

## 2013-06-19 NOTE — Telephone Encounter (Signed)
cxr won't "close the loop" as well as CT ( if CT no change then no furhter f/u)  If declines that's ok but notify referring physician that at least a yearly cxr should be done starting this month

## 2013-06-19 NOTE — Telephone Encounter (Signed)
Spoke with the pt and notified him he is due for ct chest- last one that will be needed unless there is a change Pt verbalized understanding, but declines ct at this time, feels that it is not needed He is asking if just a plain cxr would be okay  Please advise, thanks!

## 2013-06-19 NOTE — Telephone Encounter (Signed)
Message copied by Christen Butter on Tue Jun 19, 2013  1:33 PM ------      Message from: Sandrea Hughs B      Created: Mon Jun 19, 2012  5:04 PM       Ct chest repeat due ------

## 2013-06-19 NOTE — Telephone Encounter (Signed)
Spoke with the pt and notified of recs per MW This note was sent to PCP who referred him

## 2013-07-03 ENCOUNTER — Encounter: Payer: Self-pay | Admitting: Neurology

## 2013-07-17 ENCOUNTER — Telehealth: Payer: Self-pay | Admitting: Neurology

## 2013-07-17 MED ORDER — DULOXETINE HCL 30 MG PO CPEP: 30.0000 mg | ORAL_CAPSULE | Freq: Every day | ORAL | Status: DC

## 2013-07-17 NOTE — Telephone Encounter (Signed)
I called patient. The patient indicates that the 60 mg Cymbalta seemed to help, but it causes too much drowsiness. The patient is taking it before he goes to sleep. 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 I'll give him a trial on the 30 mg dose.

## 2013-09-06 ENCOUNTER — Ambulatory Visit (INDEPENDENT_AMBULATORY_CARE_PROVIDER_SITE_OTHER): Payer: Managed Care, Other (non HMO) | Admitting: Neurology

## 2013-09-06 ENCOUNTER — Encounter: Payer: Self-pay | Admitting: Neurology

## 2013-09-06 VITALS — BP 140/82 | HR 86 | Ht 71.0 in | Wt 309.0 lb

## 2013-09-06 DIAGNOSIS — E785 Hyperlipidemia, unspecified: Secondary | ICD-10-CM

## 2013-09-06 DIAGNOSIS — M79609 Pain in unspecified limb: Secondary | ICD-10-CM

## 2013-09-06 DIAGNOSIS — K219 Gastro-esophageal reflux disease without esophagitis: Secondary | ICD-10-CM

## 2013-09-06 DIAGNOSIS — G4730 Sleep apnea, unspecified: Secondary | ICD-10-CM

## 2013-09-06 DIAGNOSIS — E669 Obesity, unspecified: Secondary | ICD-10-CM

## 2013-09-06 MED ORDER — DULOXETINE HCL 30 MG PO CPEP: 30.0000 mg | ORAL_CAPSULE | Freq: Every day | ORAL | Status: DC

## 2013-09-06 MED ORDER — MELOXICAM 7.5 MG PO TABS: 7.5000 mg | ORAL_TABLET | Freq: Every day | ORAL | Status: DC

## 2013-09-06 NOTE — Progress Notes (Signed)
[name] [name] is a 57 years old right-handed Caucasian male, referred by his primary care physician Dr. 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 Zannie Kehr, I reviewed the report dated March 25th 2011, open muscle biopsy of right vastus lateralis demonstrated nonspecific fiber atrophy, no inflammatory changes noticed,   Laboratory showed a positive ANA 1-640, speckled, otherwise normal or negative CBC, CMP, and lactate acid level, carnitine level, amino acid level,   acylcarnitine level, ESR, B12 was 208, normal TSH, CPK was 83-85, ESR was 10,  He also had MRI of the brain, and spine, there is no significant abnormalities,  He was not offered any treatment, but over the past 3 years, since 05-07-10, he has been ordering prednisone through Internet from [place], initially he was taking 5 mg every day, which did improve his symptoms,  since 2011/05/08, he decreased the prednisone to 10 mg every day, now even with 10 mg every day, he remains to be symptomatic, he complains of generalized fatigue, bilateral lower extremity achy pain, subjective weakness, but denies significant gait difficulty, he has no difficulty raising arm overhead. He denies sensory loss,   He is going through a lot of stress, his wife is dying from  metastatic breast cancer.  UPDATE Oct 2nd 2014; He is taking cymbalta 30mg  qday, works well, 60mg  qday, make him drowsy, he still drives trucks, his wife passed away in 06/02/2014he lives by himself, his daughter and grandchildren live close by.  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  He complains of bilateral knee, leg tender, draw up. Aleve helps his knee pain.  Review of Systems  Out of a complete 14 system review, the patient complains of only the following symptoms, and all other reviewed systems are negative.   Constitutional:   Fatigue Cardiovascular: Swelling in his legs  Ear/Nose/Throat:  N/A Skin: N/A Eyes: N/A Respiratory: snoring Gastroitestinal: constipation    Hematology/Lymphatic: easy bruising. Endocrine:  N/A Musculoskeletal: aching muscles Allergy/Immunology: allergies Neurological: Numbness, weakness, snoring Psychiatric:    N/A  PHYSICAL EXAMINATOINS:  Generalized: In no acute distress  Neck: Supple, no carotid bruits   Cardiac: Regular rate rhythm  Pulmonary: Clear to auscultation bilaterally  Musculoskeletal: No deformity  Neurological examination  Mentation: Alert oriented to time, place, history taking, and causual conversation, obese.  Cranial nerve II-XII: Pupils were equal round reactive to light extraocular movements were full, visual field were full on confrontational test. facial sensation and strength were normal. hearing was intact to finger rubbing bilaterally. Uvula tongue midline.  head turning and shoulder shrug and were normal and symmetric.Tongue protrusion into cheek strength was normal.  Motor: normal tone, bulk and strength.  Sensory: Intact to fine touch, pinprick, preserved vibratory sensation, and proprioception at toes.  Coordination: Normal finger to nose, heel-to-shin bilaterally there was no truncal ataxia  Gait: Rising up from seated position without  assistance, normal stance, without trunk ataxia, moderate stride, good arm  swing, smooth turning, able to perform tiptoe, and heel walking without difficulty.   Romberg signs: Negative  Deep tendon reflexes: Brachioradialis 2/2, biceps 2/2, triceps 2/2, patellar 2/2, Achilles 2/2, plantar responses were flexor bilaterally.  Assessment and plan: 57 year old gentleman, with obesity, previous mild abnormal muscle biopsy of right vastus lateralis. No definite etiology was found, he has been on long-term dose of prednisone treatment, no evidence of myopathy.  1. Cymbalta 30 mg every day.  2. Mobic 7.5mg  bid prn for joints pain. 3. RTC as needed.

## 2013-10-11 ENCOUNTER — Other Ambulatory Visit: Payer: Self-pay

## 2014-04-08 ENCOUNTER — Telehealth: Payer: Self-pay | Admitting: Neurology

## 2014-04-08 NOTE — Telephone Encounter (Signed)
[name] [name]:  Please Call patient, laboratory showed normal TSH, , ANA, folic acid, protein electrophoresis, B12, mild elevated C-reactive protein 6.9 of unknown clinical significance.

## 2014-04-12 NOTE — Telephone Encounter (Signed)
Spoke to patient and relayed lab results, per Dr. Krista Blue.  Patient said he had this lab work done at [facility] on 03-25-14, and was aware of results.

## 2014-09-04 ENCOUNTER — Encounter: Payer: Self-pay | Admitting: Gastroenterology

## 2014-09-20 ENCOUNTER — Other Ambulatory Visit: Payer: Self-pay

## 2015-04-23 ENCOUNTER — Encounter: Payer: Self-pay | Admitting: Internal Medicine

## 2015-07-14 ENCOUNTER — Ambulatory Visit (AMBULATORY_SURGERY_CENTER): Payer: Self-pay

## 2015-07-14 ENCOUNTER — Encounter: Payer: Self-pay | Admitting: Internal Medicine

## 2015-07-14 VITALS — Ht 70.0 in | Wt 309.4 lb

## 2015-07-14 DIAGNOSIS — Z860100 Personal history of colon polyps, unspecified: Secondary | ICD-10-CM

## 2015-07-14 MED ORDER — SUPREP BOWEL PREP KIT 17.5-3.13-1.6 GM/177ML PO SOLN: 1.0000 | Freq: Once | ORAL | Status: DC

## 2015-07-14 NOTE — Progress Notes (Signed)
No allergies to eggs or soy No diet/weight loss meds No home oxygen No past problems with anesthesia  Refused Emmi instructions 

## 2015-07-28 ENCOUNTER — Encounter: Payer: Self-pay | Admitting: Internal Medicine

## 2015-07-28 ENCOUNTER — Ambulatory Visit (AMBULATORY_SURGERY_CENTER): Payer: BLUE CROSS/BLUE SHIELD | Admitting: Internal Medicine

## 2015-07-28 VITALS — BP 121/51 | HR 66 | Temp 97.7°F | Resp 16 | Ht 70.0 in | Wt 309.0 lb

## 2015-07-28 DIAGNOSIS — Z86010 Personal history of colonic polyps: Secondary | ICD-10-CM | POA: Diagnosis not present

## 2015-07-28 DIAGNOSIS — D123 Benign neoplasm of transverse colon: Secondary | ICD-10-CM

## 2015-07-28 DIAGNOSIS — D122 Benign neoplasm of ascending colon: Secondary | ICD-10-CM

## 2015-07-28 DIAGNOSIS — D124 Benign neoplasm of descending colon: Secondary | ICD-10-CM

## 2015-07-28 MED ORDER — SODIUM CHLORIDE 0.9 % IV SOLN: 500.0000 mL | INTRAVENOUS | Status: DC

## 2015-07-28 NOTE — Op Note (Signed)
[place]  [address] [place], [id number]   COLONOSCOPY PROCEDURE REPORT  PATIENT: [name] [name]  MR#: [id number] BIRTHDATE: 01-18-1956 , 59  yrs. old GENDER: male ENDOSCOPIST: Jerene Bears, MD REFERRED TI:[id number] Consuello Masse, M.D. PROCEDURE DATE:  07/28/2015 PROCEDURE:   Colonoscopy, surveillance and Colonoscopy with snare polypectomy First Screening Colonoscopy - Avg.  risk and is 50 yrs.  old or older - No.  Prior Negative Screening - Now for repeat screening. N/A  History of Adenoma - Now for follow-up colonoscopy & has been > or = to 3 yrs.  Yes hx of adenoma.  Has been 3 or more years since last colonoscopy.  Polyps removed today? Yes ASA CLASS:   Class III INDICATIONS:Surveillance due to prior colonic neoplasia, PH Colon Adenoma, and FH Colon cancer in father at age 54, last colonoscopy 2011, history of partial sigmoidectomy for recurrent diverticulitis  MEDICATIONS: Monitored anesthesia care and Propofol 300 mg IV  DESCRIPTION OF PROCEDURE:   After the risks benefits and alternatives of the procedure were thoroughly explained, informed consent was obtained.  The digital rectal exam revealed no rectal mass.   The [id number]  endoscope was introduced through the anus and advanced to the cecum, which was identified by both the appendix and ileocecal valve. No adverse events experienced. The quality of the prep was good.  (Suprep was used)  The instrument was then slowly withdrawn as the colon was fully examined. Estimated blood loss is zero unless otherwise noted in this procedure report.      COLON FINDINGS: A sessile polyp measuring 12 mm in size with a mucous cap was found in the ascending colon.  A polypectomy was performed with a cold snare.  The resection was complete, the polyp tissue was completely retrieved and sent to histology.   Four sessile polyps ranging from 4 to 68mm in size were found in the transverse colon and  descending colon.  Polypectomies were performed with a cold snare.  The resection was complete, the polyp tissue was completely retrieved and sent to histology.   Small lipoma was found in the proximal transverse colon.   There was moderate diverticulosis noted in the left colon.   There was evidence of a normal appearing prior surgical anastomosis in the distal sigmoid colon.  Retroflexed views revealed internal hemorrhoids. The time to cecum = 2.5 Withdrawal time = 19.3   The scope was withdrawn and the procedure completed. COMPLICATIONS: There were no immediate complications.  ENDOSCOPIC IMPRESSION: 1.   Sessile polyp was found in the ascending colon; polypectomy was performed with a cold snare 2.   Four sessile polyps ranging from 4 to 35mm in size were found in the transverse colon and descending colon; polypectomies were performed with a cold snare 3.   Small lipoma in the proximal transverse colon 4.   Moderate diverticulosis was noted in the left colon 5.   There was evidence of prior surgical anastomosis in the distal sigmoid colon  RECOMMENDATIONS: 1.  Await pathology results 2.  High fiber diet 3.  Timing of repeat colonoscopy will be determined by pathology findings. 4.  You will receive a letter within 1-2 weeks with the results of your biopsy as well as final recommendations.  Please call my office if you have not received a letter after 3 weeks.  eSigned:  Jerene Bears, MD 07/28/2015 9:11 AM   cc: Orpah Melter, MD and The Patient   PATIENT NAME:  [name],  [name] MR#: [id number]

## 2015-07-28 NOTE — Progress Notes (Signed)
Transferred to recovery room. A/O x3, pleased with MAC.  VSS.  Report to [facility].

## 2015-07-28 NOTE — Patient Instructions (Signed)
YOU HAD AN ENDOSCOPIC PROCEDURE TODAY AT [facility]:   Refer to the procedure report that was given to you for any specific questions about what was found during the examination.  If the procedure report does not answer your questions, please call your gastroenterologist to clarify.  If you requested that your care partner not be given the details of your procedure findings, then the procedure report has been included in a sealed envelope for you to review at your convenience later.  YOU SHOULD EXPECT: Some feelings of bloating in the abdomen. Passage of more gas than usual.  Walking can help get rid of the air that was put into your GI tract during the procedure and reduce the bloating. If you had a lower endoscopy (such as a colonoscopy or flexible sigmoidoscopy) you may notice spotting of blood in your stool or on the toilet paper. If you underwent a bowel prep for your procedure, you may not have a normal bowel movement for a few days.  Please Note:  You might notice some irritation and congestion in your nose or some drainage.  This is from the oxygen used during your procedure.  There is no need for concern and it should clear up in a day or so.  SYMPTOMS TO REPORT IMMEDIATELY:   Following lower endoscopy (colonoscopy or flexible sigmoidoscopy):  Excessive amounts of blood in the stool  Significant tenderness or worsening of abdominal pains  Swelling of the abdomen that is new, acute  Fever of 100F or higher  F  For urgent or emergent issues, a gastroenterologist can be reached at any hour by calling [phone].   DIET: Your first meal following the procedure should be a small meal and then it is ok to progress to your normal diet. Heavy or fried foods are harder to digest and may make you feel nauseous or bloated.  Likewise, meals heavy in dairy and vegetables can increase bloating.  Drink plenty of fluids but you should avoid alcoholic beverages for 24  hours.  ACTIVITY:  You should plan to take it easy for the rest of today and you should NOT DRIVE or use heavy machinery until tomorrow (because of the sedation medicines used during the test).    FOLLOW UP: Our staff will call the number listed on your records the next business day following your procedure to check on you and address any questions or concerns that you may have regarding the information given to you following your procedure. If we do not reach you, we will leave a message.  However, if you are feeling well and you are not experiencing any problems, there is no need to return our call.  We will assume that you have returned to your regular daily activities without incident.  If any biopsies were taken you will be contacted by phone or by letter within the next 1-3 weeks.  Please call us at [phone] if you have not heard about the biopsies in 3 weeks.    SIGNATURES/CONFIDENTIALITY: You and/or your care partner have signed paperwork which will be entered into your electronic medical record.  These signatures attest to the fact that that the information above on your After Visit Summary has been reviewed and is understood.  Full responsibility of the confidentiality of this discharge information lies with you and/or your care-partner.   INFORMATION ON POLYPS,DIVERTICULOSIS,& HIGH FIBER DIET GIVEN TO YOU TODAY

## 2015-07-28 NOTE — Progress Notes (Signed)
Called to room to assist during endoscopic procedure.  Patient ID and intended procedure confirmed with present staff. Received instructions for my participation in the procedure from the performing physician.  

## 2015-07-29 ENCOUNTER — Telehealth: Payer: Self-pay | Admitting: Emergency Medicine

## 2015-07-29 NOTE — Telephone Encounter (Signed)
  Follow up Call-  Call back number 07/28/2015  Post procedure Call Back phone  # [phone]  Permission to leave phone message Yes     Patient questions:  Do you have a fever, pain , or abdominal swelling? No. Pain Score  0 *  Have you tolerated food without any problems? Yes.    Have you been able to return to your normal activities? Yes.    Do you have any questions about your discharge instructions: Diet   No. Medications  No. Follow up visit  No.  Do you have questions or concerns about your Care? No.  Actions: * If pain score is 4 or above: No action needed, pain <4.

## 2015-08-01 ENCOUNTER — Encounter: Payer: Self-pay | Admitting: Internal Medicine

## 2016-04-12 DIAGNOSIS — Z961 Presence of intraocular lens: Secondary | ICD-10-CM | POA: Diagnosis not present

## 2016-04-16 DIAGNOSIS — M170 Bilateral primary osteoarthritis of knee: Secondary | ICD-10-CM | POA: Diagnosis not present

## 2016-04-19 DIAGNOSIS — M791 Myalgia: Secondary | ICD-10-CM | POA: Diagnosis not present

## 2016-04-19 DIAGNOSIS — Z0000 Encounter for general adult medical examination without abnormal findings: Secondary | ICD-10-CM | POA: Diagnosis not present

## 2016-04-19 DIAGNOSIS — R0789 Other chest pain: Secondary | ICD-10-CM | POA: Diagnosis not present

## 2016-04-19 DIAGNOSIS — R5383 Other fatigue: Secondary | ICD-10-CM | POA: Diagnosis not present

## 2016-04-19 DIAGNOSIS — M109 Gout, unspecified: Secondary | ICD-10-CM | POA: Diagnosis not present

## 2016-04-19 DIAGNOSIS — E7800 Pure hypercholesterolemia, unspecified: Secondary | ICD-10-CM | POA: Diagnosis not present

## 2016-04-19 DIAGNOSIS — Z125 Encounter for screening for malignant neoplasm of prostate: Secondary | ICD-10-CM | POA: Diagnosis not present

## 2016-04-26 ENCOUNTER — Telehealth: Payer: Self-pay | Admitting: Cardiovascular Disease

## 2016-04-26 NOTE — Telephone Encounter (Signed)
Received records from [place] for appointment on 05/14/16 with Dr Gwenlyn Found.  Records given to [facility] (medical records) for Dr Kennon Holter schedule on 05/14/16. lp

## 2016-05-04 DIAGNOSIS — H5319 Other subjective visual disturbances: Secondary | ICD-10-CM | POA: Diagnosis not present

## 2016-05-14 ENCOUNTER — Ambulatory Visit (INDEPENDENT_AMBULATORY_CARE_PROVIDER_SITE_OTHER): Payer: BLUE CROSS/BLUE SHIELD | Admitting: Cardiovascular Disease

## 2016-05-14 ENCOUNTER — Encounter: Payer: Self-pay | Admitting: Cardiovascular Disease

## 2016-05-14 VITALS — BP 118/88 | HR 70 | Ht 68.0 in | Wt 314.8 lb

## 2016-05-14 DIAGNOSIS — R0602 Shortness of breath: Secondary | ICD-10-CM

## 2016-05-14 DIAGNOSIS — R079 Chest pain, unspecified: Secondary | ICD-10-CM | POA: Insufficient documentation

## 2016-05-14 NOTE — Assessment & Plan Note (Signed)
History of exertional chest pain over the last several months associated with shortness of breath. He does have risk factors including hypertension and hyperlipidemia. We will get a 2-D echo and a pharmacologic Myoview stress test to rule out an ischemic etiology.

## 2016-05-14 NOTE — Progress Notes (Signed)
05/14/2016 [name] [name]   1956-07-17  [id number]  Primary Physician Orpah Melter, MD Primary Cardiologist: Lorretta Harp MD Renae Gloss  HPI:  [name] [name] is a 60 year old severely overweight widowed Caucasian male father of one daughter referred by Corine Shel[facility] for evaluation of chest pain. His risk factors include treated hypertension and hyperlipidemia. He does have sleep apnea on sleep. He works as a Programmer, systems. 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 He noticed exertional chest pain last several months with associated dyspnea.   Current Outpatient Prescriptions  Medication Sig Dispense Refill  . aspirin 81 MG tablet Take 81 mg by mouth daily.    . calcium carbonate (TUMS - DOSED IN MG ELEMENTAL CALCIUM) 500 MG chewable tablet Chew 1 tablet by mouth as needed for indigestion or heartburn.    . Cholecalciferol (VITAMIN D3) 5000 units TABS Take 1 tablet by mouth daily.    . Coenzyme Q10 (CO Q 10) 10 MG CAPS Take 1 capsule by mouth daily.    Marland Kitchen diltiazem (TIAZAC) 360 MG 24 hr capsule Take 360 mg by mouth daily.    . Magnesium 200 MG TABS Take 2 tablets by mouth daily.    . Multiple Vitamin (MULTI-VITAMINS) TABS Take 1 tablet by mouth daily.    . Omega-3 300 MG CAPS Take 1 capsule by mouth daily.    . Potassium 99 MG TABS Take 1 tablet by mouth daily.    . vitamin E 1000 UNIT capsule Take 1,000 Units by mouth daily.     No current facility-administered medications for this visit.    Allergies  Allergen Reactions  . Ciprofloxacin     Neuropathy, tendon damage  . Doxycycline     REACTION: hives  . Statins     Social History   Social History  . Marital Status: Married    Spouse Name: N/A  . Number of Children: 1  . Years of Education: college   Occupational History  .  [employer]   Social History Main Topics  . Smoking status: Never Smoker   . Smokeless tobacco: Never Used  . Alcohol Use: 0.6 oz/week    1 Glasses of wine  per week     Comment: occ   . Drug Use: No  . Sexual Activity: Not on file   Other Topics Concern  . Not on file   Social History Narrative   Occ caffeine .   Patient lives at home alone. Patient is widowed. Patient works full time Administrator.   Right handed.           Review of Systems: General: negative for chills, fever, night sweats or weight changes.  Cardiovascular: negative for chest pain, dyspnea on exertion, edema, orthopnea, palpitations, paroxysmal nocturnal dyspnea or shortness of breath Dermatological: negative for rash Respiratory: negative for cough or wheezing Urologic: negative for hematuria Abdominal: negative for nausea, vomiting, diarrhea, bright red blood per rectum, melena, or hematemesis Neurologic: negative for visual changes, syncope, or dizziness All other systems reviewed and are otherwise negative except as noted above.    Blood pressure 118/88, pulse 70, height 5\' 8"  (1.727 m), weight 314 lb 12.8 oz (142.792 kg).  General appearance: alert and no distress Neck: no adenopathy, no carotid bruit, no JVD, supple, symmetrical, trachea midline and thyroid not enlarged, symmetric, no tenderness/mass/nodules Lungs: clear to auscultation bilaterally Heart: regular rate and rhythm, S1, S2 normal, no murmur, click, rub or gallop Extremities: extremities normal,  atraumatic, no cyanosis or edema  EKG ormal sinus rhythm at 70 without ST or T-wave changes. I personally reviewed this EKG  ASSESSMENT AND PLAN:   Hypertension history of hypertension blood pressure measures 118/88. He is on diltiazem. Continue current meds at current dosing  Hyperlipemia history of hyperlipidemia not on statin therapy followed by his PCP  Chest pain History of exertional chest pain over the last several months associated with shortness of breath. He does have risk factors including hypertension and hyperlipidemia. We will get a 2-D echo and a pharmacologic Myoview stress test  to rule out an ischemic etiology.      Lorretta Harp MD FACP,FACC,FAHA, [facility] 05/14/2016 12:23 PM

## 2016-05-14 NOTE — Assessment & Plan Note (Signed)
history of hypertension blood pressure measures 118/88. He is on diltiazem. Continue current meds at current dosing

## 2016-05-14 NOTE — Patient Instructions (Signed)
Medication Instructions:  Your physician recommends that you continue on your current medications as directed. Please refer to the Current Medication list given to you today.   Labwork: NONE  Testing/Procedures: Your physician has requested that you have an echocardiogram. Echocardiography is a painless test that uses sound waves to create images of your heart. It provides your doctor with information about the size and shape of your heart and how well your heart's chambers and valves are working. This procedure takes approximately one hour. There are no restrictions for this procedure.  Your physician has requested that you have a lexiscan myoview. For further information please visit [email or web address]. Please follow instruction sheet, as given.    Follow-Up: Your physician recommends that you schedule a follow-up appointment in: 4-5 [place].   Pharmacologic Stress Echocardiogram A pharmacologic stress echocardiogram is a heart (cardiac) test used to check the function of your heart. This test may also be called a pharmacologic stress echocardiography. Pharmacologic means that a medicine is used to increase your heart rate and blood pressure.  This stress test will check how well your heart muscle and valves are working and determine if your heart muscle is getting enough blood. Some people exercise on a treadmill, which naturally increases or stresses the functioning of their heart. For those people unable to exercise on a treadmill, a medicine is used. This medicine stimulates your heart and will cause your heart to beat harder and more quickly, as if you were exercising.  An echocardiogram uses sound waves (ultrasound) to produce an image of your heart. If your heart does not work normally, it may indicate coronary artery disease with poor coronary blood supply. The coronary arteries are the arteries that bring blood and oxygen to your heart. LET [facility] CARE  PROVIDER KNOW ABOUT:  Any allergies you have.  All medicines you are taking, including vitamins, herbs, eye drops, creams, and over-the-counter medicines.  Previous problems you or members of your family have had with the use of anesthetics.  Any blood disorders you have.  Previous surgeries you have had.  Medical conditions you have.  Possibility of pregnancy, if this applies. RISKS AND COMPLICATIONS Generally, this is a safe procedure. However, as with any procedure, complications can occur. Possible complications can include:  You develop pain or pressure in the following areas:  Chest.  Jaw or neck.  Between your shoulder blades.  Radiating down your left arm.  Headache.  Dizziness or light-headedness.  Shortness of breath.  Increased or irregular heartbeat.  Low blood pressure.  Nausea or vomiting.  Flushing.  Redness going up the arm and slight pain during injection of medicine.  Heart attack (rare). BEFORE THE PROCEDURE  Avoid all forms of caffeine for 24 hours before your test or as directed by your health care provider. This includes coffee, tea (even decaffeinated tea), caffeinated sodas, chocolate, cocoa, and certain pain medicines.  Follow your health care provider's instructions regarding eating and drinking before the test.  Take your medicines as directed at regular times with water unless instructed otherwise. Exceptions may include:  If you have diabetes, ask how you are to take your insulin or pills. It is common to adjust insulin dosing the morning of the test.  If you are taking beta-blocker medicines, it is important to talk to your health care provider about these medicines well before the date of your test. Taking beta-blocker medicines may interfere with the test. In some cases, these medicines need  to be changed or stopped 24 hours or more before the test.  If you wear a nitroglycerin patch, it may need to be removed prior to the test.  Ask your health care provider if the patch should be removed before the test.  If you use an inhaler for any breathing condition, bring it with you to the test.  If you are an outpatient, bring a snack so you can eat right after the stress phase of the test.  Do not smoke for 4 hours prior to the test or as directed by your health care provider.  Wear comfortable shoes and clothing. Let your health care provider know if you were unable to complete or follow the preparations for your test. PROCEDURE   Multiple electrodes will be put on your chest. If needed, small areas of your chest may be shaved to get better contact with the electrodes. Once the electrodes are attached to your body, multiple wires will be attached to the electrodes, and your heart rate will be monitored.  An IV access will be started, and medicine will be given.  You will have an echocardiogram done at rest and done again at peak heart rate.  To produce an image of the heart, gel is applied to your chest, and a wand-like tool (transducer) is moved over the chest. The transducer sends the sound waves through the chest to create the moving images of your heart. AFTER THE PROCEDURE  Your heart rate and blood pressure will be monitored after the test.  You may return to your normal schedule, including diet, activities, and medicines, unless your health care provider tells you otherwise.   This information is not intended to replace advice given to you by your health care provider. Make sure you discuss any questions you have with your health care provider.   Document Released: 02/12/2004 Document Revised: 11/27/2013 Document Reviewed: 07/30/2013 Elsevier Interactive Patient Education 2016 [employer].    Any Other Special Instructions Will Be Listed Below (If Applicable).     If you need a refill on your cardiac medications before your next appointment, please call your pharmacy.

## 2016-05-14 NOTE — Assessment & Plan Note (Signed)
history of hyperlipidemia not on statin therapy followed by his PCP

## 2016-05-20 ENCOUNTER — Telehealth (HOSPITAL_COMMUNITY): Payer: Self-pay

## 2016-05-20 NOTE — Telephone Encounter (Signed)
Encounter complete. 

## 2016-05-25 ENCOUNTER — Ambulatory Visit (HOSPITAL_COMMUNITY)
Admission: RE | Admit: 2016-05-25 | Discharge: 2016-05-25 | Disposition: A | Discharge status: Home / Self Care | Payer: BLUE CROSS/BLUE SHIELD | Source: Ambulatory Visit | Attending: Cardiology | Admitting: Cardiology

## 2016-05-25 DIAGNOSIS — R002 Palpitations: Secondary | ICD-10-CM | POA: Insufficient documentation

## 2016-05-25 DIAGNOSIS — R0602 Shortness of breath: Secondary | ICD-10-CM | POA: Diagnosis not present

## 2016-05-25 DIAGNOSIS — R5383 Other fatigue: Secondary | ICD-10-CM | POA: Insufficient documentation

## 2016-05-25 DIAGNOSIS — R079 Chest pain, unspecified: Secondary | ICD-10-CM | POA: Diagnosis not present

## 2016-05-25 DIAGNOSIS — I10 Essential (primary) hypertension: Secondary | ICD-10-CM | POA: Insufficient documentation

## 2016-05-25 DIAGNOSIS — R0609 Other forms of dyspnea: Secondary | ICD-10-CM | POA: Diagnosis not present

## 2016-05-25 DIAGNOSIS — Z6841 Body Mass Index (BMI) 40.0 and over, adult: Secondary | ICD-10-CM | POA: Insufficient documentation

## 2016-05-25 DIAGNOSIS — E669 Obesity, unspecified: Secondary | ICD-10-CM | POA: Insufficient documentation

## 2016-05-25 MED ORDER — REGADENOSON 0.4 MG/5ML IV SOLN
0.4000 mg | Freq: Once | INTRAVENOUS | Status: AC
  Administered 2016-05-25: 0.4 mg via INTRAVENOUS

## 2016-05-25 MED ORDER — TECHNETIUM TC 99M TETROFOSMIN IV KIT
32.0000 | PACK | Freq: Once | INTRAVENOUS | Status: AC | PRN
  Administered 2016-05-25: 32 via INTRAVENOUS
  Filled 2016-05-25: qty 32

## 2016-05-26 ENCOUNTER — Ambulatory Visit (HOSPITAL_COMMUNITY)
Admission: RE | Admit: 2016-05-26 | Discharge: 2016-05-26 | Disposition: A | Discharge status: Home / Self Care | Payer: BLUE CROSS/BLUE SHIELD | Source: Ambulatory Visit | Attending: Cardiovascular Disease | Admitting: Cardiovascular Disease

## 2016-05-26 ENCOUNTER — Encounter (HOSPITAL_COMMUNITY): Payer: BLUE CROSS/BLUE SHIELD

## 2016-05-26 LAB — MYOCARDIAL PERFUSION IMAGING
CHL CUP NUCLEAR SDS: 0
CHL CUP NUCLEAR SRS: 0
CHL CUP NUCLEAR SSS: 0
CHL CUP RESTING HR STRESS: 64 {beats}/min
LV sys vol: 61 mL
LVDIAVOL: 131 mL (ref 62–150)
NUC STRESS TID: 0.99
Peak HR: 85 {beats}/min

## 2016-05-26 MED ORDER — TECHNETIUM TC 99M TETROFOSMIN IV KIT
31.9000 | PACK | Freq: Once | INTRAVENOUS | Status: AC | PRN
  Administered 2016-05-26: 31.9 via INTRAVENOUS

## 2016-06-01 ENCOUNTER — Other Ambulatory Visit: Payer: Self-pay

## 2016-06-01 ENCOUNTER — Ambulatory Visit (HOSPITAL_COMMUNITY): Payer: BLUE CROSS/BLUE SHIELD | Attending: Cardiology

## 2016-06-01 DIAGNOSIS — I119 Hypertensive heart disease without heart failure: Secondary | ICD-10-CM | POA: Diagnosis not present

## 2016-06-01 DIAGNOSIS — R079 Chest pain, unspecified: Secondary | ICD-10-CM | POA: Insufficient documentation

## 2016-06-01 DIAGNOSIS — I351 Nonrheumatic aortic (valve) insufficiency: Secondary | ICD-10-CM | POA: Insufficient documentation

## 2016-06-01 DIAGNOSIS — I77810 Thoracic aortic ectasia: Secondary | ICD-10-CM | POA: Insufficient documentation

## 2016-06-01 DIAGNOSIS — E785 Hyperlipidemia, unspecified: Secondary | ICD-10-CM | POA: Insufficient documentation

## 2016-06-01 DIAGNOSIS — R0602 Shortness of breath: Secondary | ICD-10-CM | POA: Diagnosis not present

## 2016-06-01 DIAGNOSIS — E669 Obesity, unspecified: Secondary | ICD-10-CM | POA: Diagnosis not present

## 2016-06-01 DIAGNOSIS — Z6841 Body Mass Index (BMI) 40.0 and over, adult: Secondary | ICD-10-CM | POA: Diagnosis not present

## 2016-06-01 LAB — ECHOCARDIOGRAM COMPLETE
AOASC: 36 cm
E decel time: 218 msec
EERAT: 10
FS: 36 % (ref 28–44)
IV/PV OW: 1
LA diam end sys: 42 mm
LA vol: 74 mL
LADIAMINDEX: 1.56 cm/m2
LASIZE: 42 mm
LAVOLA4C: 71 mL
LAVOLIN: 27.5 mL/m2
LDCA: 4.52 cm2
LV E/e'average: 10
LV TDI E'LATERAL: 6.8
LV e' LATERAL: 6.8 cm/s
LVEEMED: 10
LVOT SV: 103 mL
LVOT VTI: 22.8 cm
LVOT diameter: 24 mm
LVOT peak grad rest: 5 mmHg
LVOT peak vel: 109 cm/s
MV Dec: 218
MV pk A vel: 77.1 m/s
MV pk E vel: 68 m/s
PW: 12 mm — AB (ref 0.6–1.1)
TDI e' medial: 7.79

## 2016-06-04 DIAGNOSIS — C61 Malignant neoplasm of prostate: Secondary | ICD-10-CM | POA: Diagnosis not present

## 2016-06-11 DIAGNOSIS — N3941 Urge incontinence: Secondary | ICD-10-CM | POA: Diagnosis not present

## 2016-06-11 DIAGNOSIS — C61 Malignant neoplasm of prostate: Secondary | ICD-10-CM | POA: Diagnosis not present

## 2016-06-11 DIAGNOSIS — N401 Enlarged prostate with lower urinary tract symptoms: Secondary | ICD-10-CM | POA: Diagnosis not present

## 2016-06-23 ENCOUNTER — Encounter: Payer: Self-pay | Admitting: Cardiovascular Disease

## 2016-06-23 ENCOUNTER — Ambulatory Visit (INDEPENDENT_AMBULATORY_CARE_PROVIDER_SITE_OTHER): Payer: BLUE CROSS/BLUE SHIELD | Admitting: Cardiovascular Disease

## 2016-06-23 VITALS — BP 112/68 | HR 68 | Ht 68.0 in | Wt 305.0 lb

## 2016-06-23 DIAGNOSIS — R079 Chest pain, unspecified: Secondary | ICD-10-CM

## 2016-06-23 NOTE — Assessment & Plan Note (Signed)
[name] [name] returns today for follow-up of his outpatient tests ordered to evaluate chest pain. His 2-D echo was normal as was his Myoview stress test. His symptoms are somewhat improved. I reassured him that based on this test is unlikely the symptoms are cardiac in nature. I will see him back when necessary

## 2016-06-23 NOTE — Patient Instructions (Signed)
Medication Instructions:  Your physician recommends that you continue on your current medications as directed. Please refer to the Current Medication list given to you today.   Follow-Up: Your physician recommends that you schedule a follow-up appointment ON AN AS NEEDED BASIS.  If you need a refill on your cardiac medications before your next appointment, please call your pharmacy.   

## 2016-06-23 NOTE — Progress Notes (Signed)
[name] [name] returns today for follow-up of his outpatient tests ordered to evaluate chest pain. His 2-D echo was normal as was his Myoview stress test. His symptoms are somewhat improved. I reassured him that based on this test is unlikely the symptoms are cardiac in nature. I will see him back when necessar

## 2016-09-06 DIAGNOSIS — K625 Hemorrhage of anus and rectum: Secondary | ICD-10-CM | POA: Diagnosis not present

## 2016-10-25 DIAGNOSIS — Z0000 Encounter for general adult medical examination without abnormal findings: Secondary | ICD-10-CM | POA: Diagnosis not present

## 2016-10-25 DIAGNOSIS — Z23 Encounter for immunization: Secondary | ICD-10-CM | POA: Diagnosis not present

## 2016-10-25 DIAGNOSIS — L853 Xerosis cutis: Secondary | ICD-10-CM | POA: Diagnosis not present

## 2016-10-25 DIAGNOSIS — I10 Essential (primary) hypertension: Secondary | ICD-10-CM | POA: Diagnosis not present

## 2016-10-25 DIAGNOSIS — Z1389 Encounter for screening for other disorder: Secondary | ICD-10-CM | POA: Diagnosis not present

## 2016-10-25 DIAGNOSIS — E119 Type 2 diabetes mellitus without complications: Secondary | ICD-10-CM | POA: Diagnosis not present

## 2016-10-25 DIAGNOSIS — E782 Mixed hyperlipidemia: Secondary | ICD-10-CM | POA: Diagnosis not present

## 2016-11-15 DIAGNOSIS — H26491 Other secondary cataract, right eye: Secondary | ICD-10-CM | POA: Diagnosis not present

## 2016-11-15 DIAGNOSIS — Z961 Presence of intraocular lens: Secondary | ICD-10-CM | POA: Diagnosis not present

## 2016-11-15 DIAGNOSIS — H43393 Other vitreous opacities, bilateral: Secondary | ICD-10-CM | POA: Diagnosis not present

## 2016-11-15 DIAGNOSIS — H33011 Retinal detachment with single break, right eye: Secondary | ICD-10-CM | POA: Diagnosis not present

## 2016-12-07 DIAGNOSIS — C61 Malignant neoplasm of prostate: Secondary | ICD-10-CM | POA: Diagnosis not present

## 2016-12-13 DIAGNOSIS — R3915 Urgency of urination: Secondary | ICD-10-CM | POA: Diagnosis not present

## 2016-12-13 DIAGNOSIS — C61 Malignant neoplasm of prostate: Secondary | ICD-10-CM | POA: Diagnosis not present

## 2016-12-13 DIAGNOSIS — N401 Enlarged prostate with lower urinary tract symptoms: Secondary | ICD-10-CM | POA: Diagnosis not present

## 2016-12-13 DIAGNOSIS — R350 Frequency of micturition: Secondary | ICD-10-CM | POA: Diagnosis not present

## 2017-01-17 DIAGNOSIS — C61 Malignant neoplasm of prostate: Secondary | ICD-10-CM | POA: Diagnosis not present

## 2017-01-24 DIAGNOSIS — R350 Frequency of micturition: Secondary | ICD-10-CM | POA: Diagnosis not present

## 2017-01-24 DIAGNOSIS — C61 Malignant neoplasm of prostate: Secondary | ICD-10-CM | POA: Diagnosis not present

## 2017-01-24 DIAGNOSIS — N401 Enlarged prostate with lower urinary tract symptoms: Secondary | ICD-10-CM | POA: Diagnosis not present

## 2017-02-14 DIAGNOSIS — Z6838 Body mass index (BMI) 38.0-38.9, adult: Secondary | ICD-10-CM | POA: Diagnosis not present

## 2017-02-14 DIAGNOSIS — E785 Hyperlipidemia, unspecified: Secondary | ICD-10-CM | POA: Diagnosis not present

## 2017-02-14 DIAGNOSIS — G4730 Sleep apnea, unspecified: Secondary | ICD-10-CM | POA: Diagnosis not present

## 2017-02-14 DIAGNOSIS — I10 Essential (primary) hypertension: Secondary | ICD-10-CM | POA: Diagnosis not present

## 2017-02-14 DIAGNOSIS — M170 Bilateral primary osteoarthritis of knee: Secondary | ICD-10-CM | POA: Diagnosis not present

## 2017-02-14 DIAGNOSIS — K219 Gastro-esophageal reflux disease without esophagitis: Secondary | ICD-10-CM | POA: Diagnosis not present

## 2017-02-14 DIAGNOSIS — M1712 Unilateral primary osteoarthritis, left knee: Secondary | ICD-10-CM | POA: Diagnosis not present

## 2017-02-22 DIAGNOSIS — G8918 Other acute postprocedural pain: Secondary | ICD-10-CM | POA: Diagnosis not present

## 2017-02-22 DIAGNOSIS — G629 Polyneuropathy, unspecified: Secondary | ICD-10-CM | POA: Diagnosis not present

## 2017-02-22 DIAGNOSIS — E785 Hyperlipidemia, unspecified: Secondary | ICD-10-CM | POA: Diagnosis not present

## 2017-02-22 DIAGNOSIS — I10 Essential (primary) hypertension: Secondary | ICD-10-CM | POA: Diagnosis not present

## 2017-02-22 DIAGNOSIS — M1712 Unilateral primary osteoarthritis, left knee: Secondary | ICD-10-CM | POA: Diagnosis not present

## 2017-02-22 DIAGNOSIS — G8929 Other chronic pain: Secondary | ICD-10-CM | POA: Diagnosis not present

## 2017-02-22 DIAGNOSIS — E669 Obesity, unspecified: Secondary | ICD-10-CM | POA: Diagnosis not present

## 2017-02-22 DIAGNOSIS — Z6841 Body Mass Index (BMI) 40.0 and over, adult: Secondary | ICD-10-CM | POA: Diagnosis not present

## 2017-02-22 DIAGNOSIS — K210 Gastro-esophageal reflux disease with esophagitis: Secondary | ICD-10-CM | POA: Diagnosis not present

## 2017-02-22 DIAGNOSIS — G4730 Sleep apnea, unspecified: Secondary | ICD-10-CM | POA: Diagnosis not present

## 2017-02-22 DIAGNOSIS — E079 Disorder of thyroid, unspecified: Secondary | ICD-10-CM | POA: Diagnosis not present

## 2017-02-22 DIAGNOSIS — M109 Gout, unspecified: Secondary | ICD-10-CM | POA: Diagnosis not present

## 2017-02-23 DIAGNOSIS — G8918 Other acute postprocedural pain: Secondary | ICD-10-CM | POA: Diagnosis not present

## 2017-02-24 DIAGNOSIS — R262 Difficulty in walking, not elsewhere classified: Secondary | ICD-10-CM | POA: Diagnosis not present

## 2017-02-24 DIAGNOSIS — M25562 Pain in left knee: Secondary | ICD-10-CM | POA: Diagnosis not present

## 2017-02-24 DIAGNOSIS — R269 Unspecified abnormalities of gait and mobility: Secondary | ICD-10-CM | POA: Diagnosis not present

## 2017-02-24 DIAGNOSIS — M170 Bilateral primary osteoarthritis of knee: Secondary | ICD-10-CM | POA: Diagnosis not present

## 2017-03-01 DIAGNOSIS — R269 Unspecified abnormalities of gait and mobility: Secondary | ICD-10-CM | POA: Diagnosis not present

## 2017-03-01 DIAGNOSIS — M170 Bilateral primary osteoarthritis of knee: Secondary | ICD-10-CM | POA: Diagnosis not present

## 2017-03-01 DIAGNOSIS — R262 Difficulty in walking, not elsewhere classified: Secondary | ICD-10-CM | POA: Diagnosis not present

## 2017-03-01 DIAGNOSIS — M25562 Pain in left knee: Secondary | ICD-10-CM | POA: Diagnosis not present

## 2017-03-03 DIAGNOSIS — R262 Difficulty in walking, not elsewhere classified: Secondary | ICD-10-CM | POA: Diagnosis not present

## 2017-03-03 DIAGNOSIS — R269 Unspecified abnormalities of gait and mobility: Secondary | ICD-10-CM | POA: Diagnosis not present

## 2017-03-03 DIAGNOSIS — M25562 Pain in left knee: Secondary | ICD-10-CM | POA: Diagnosis not present

## 2017-03-03 DIAGNOSIS — M170 Bilateral primary osteoarthritis of knee: Secondary | ICD-10-CM | POA: Diagnosis not present

## 2017-03-08 DIAGNOSIS — R269 Unspecified abnormalities of gait and mobility: Secondary | ICD-10-CM | POA: Diagnosis not present

## 2017-03-08 DIAGNOSIS — M25562 Pain in left knee: Secondary | ICD-10-CM | POA: Diagnosis not present

## 2017-03-08 DIAGNOSIS — R262 Difficulty in walking, not elsewhere classified: Secondary | ICD-10-CM | POA: Diagnosis not present

## 2017-03-08 DIAGNOSIS — M170 Bilateral primary osteoarthritis of knee: Secondary | ICD-10-CM | POA: Diagnosis not present

## 2017-03-09 DIAGNOSIS — M25562 Pain in left knee: Secondary | ICD-10-CM | POA: Diagnosis not present

## 2017-03-09 DIAGNOSIS — Z96652 Presence of left artificial knee joint: Secondary | ICD-10-CM | POA: Diagnosis not present

## 2017-03-09 DIAGNOSIS — M1711 Unilateral primary osteoarthritis, right knee: Secondary | ICD-10-CM | POA: Diagnosis not present

## 2017-03-15 DIAGNOSIS — M25562 Pain in left knee: Secondary | ICD-10-CM | POA: Diagnosis not present

## 2017-03-15 DIAGNOSIS — R269 Unspecified abnormalities of gait and mobility: Secondary | ICD-10-CM | POA: Diagnosis not present

## 2017-03-15 DIAGNOSIS — R262 Difficulty in walking, not elsewhere classified: Secondary | ICD-10-CM | POA: Diagnosis not present

## 2017-03-15 DIAGNOSIS — M170 Bilateral primary osteoarthritis of knee: Secondary | ICD-10-CM | POA: Diagnosis not present

## 2017-03-17 DIAGNOSIS — M25562 Pain in left knee: Secondary | ICD-10-CM | POA: Diagnosis not present

## 2017-03-17 DIAGNOSIS — R269 Unspecified abnormalities of gait and mobility: Secondary | ICD-10-CM | POA: Diagnosis not present

## 2017-03-17 DIAGNOSIS — R262 Difficulty in walking, not elsewhere classified: Secondary | ICD-10-CM | POA: Diagnosis not present

## 2017-03-17 DIAGNOSIS — M170 Bilateral primary osteoarthritis of knee: Secondary | ICD-10-CM | POA: Diagnosis not present

## 2017-03-21 DIAGNOSIS — M170 Bilateral primary osteoarthritis of knee: Secondary | ICD-10-CM | POA: Diagnosis not present

## 2017-03-21 DIAGNOSIS — R262 Difficulty in walking, not elsewhere classified: Secondary | ICD-10-CM | POA: Diagnosis not present

## 2017-03-21 DIAGNOSIS — M25562 Pain in left knee: Secondary | ICD-10-CM | POA: Diagnosis not present

## 2017-03-21 DIAGNOSIS — R269 Unspecified abnormalities of gait and mobility: Secondary | ICD-10-CM | POA: Diagnosis not present

## 2017-03-24 DIAGNOSIS — M170 Bilateral primary osteoarthritis of knee: Secondary | ICD-10-CM | POA: Diagnosis not present

## 2017-03-24 DIAGNOSIS — M25562 Pain in left knee: Secondary | ICD-10-CM | POA: Diagnosis not present

## 2017-03-24 DIAGNOSIS — R269 Unspecified abnormalities of gait and mobility: Secondary | ICD-10-CM | POA: Diagnosis not present

## 2017-03-24 DIAGNOSIS — R262 Difficulty in walking, not elsewhere classified: Secondary | ICD-10-CM | POA: Diagnosis not present

## 2017-03-29 DIAGNOSIS — R262 Difficulty in walking, not elsewhere classified: Secondary | ICD-10-CM | POA: Diagnosis not present

## 2017-03-29 DIAGNOSIS — M170 Bilateral primary osteoarthritis of knee: Secondary | ICD-10-CM | POA: Diagnosis not present

## 2017-03-29 DIAGNOSIS — R269 Unspecified abnormalities of gait and mobility: Secondary | ICD-10-CM | POA: Diagnosis not present

## 2017-03-29 DIAGNOSIS — M25562 Pain in left knee: Secondary | ICD-10-CM | POA: Diagnosis not present

## 2017-03-31 DIAGNOSIS — R269 Unspecified abnormalities of gait and mobility: Secondary | ICD-10-CM | POA: Diagnosis not present

## 2017-03-31 DIAGNOSIS — R262 Difficulty in walking, not elsewhere classified: Secondary | ICD-10-CM | POA: Diagnosis not present

## 2017-03-31 DIAGNOSIS — M25562 Pain in left knee: Secondary | ICD-10-CM | POA: Diagnosis not present

## 2017-03-31 DIAGNOSIS — M170 Bilateral primary osteoarthritis of knee: Secondary | ICD-10-CM | POA: Diagnosis not present

## 2017-05-09 DIAGNOSIS — E559 Vitamin D deficiency, unspecified: Secondary | ICD-10-CM | POA: Diagnosis not present

## 2017-05-09 DIAGNOSIS — I10 Essential (primary) hypertension: Secondary | ICD-10-CM | POA: Diagnosis not present

## 2017-05-09 DIAGNOSIS — M109 Gout, unspecified: Secondary | ICD-10-CM | POA: Diagnosis not present

## 2017-05-09 DIAGNOSIS — E039 Hypothyroidism, unspecified: Secondary | ICD-10-CM | POA: Diagnosis not present

## 2017-05-09 DIAGNOSIS — Z125 Encounter for screening for malignant neoplasm of prostate: Secondary | ICD-10-CM | POA: Diagnosis not present

## 2017-05-09 DIAGNOSIS — E7800 Pure hypercholesterolemia, unspecified: Secondary | ICD-10-CM | POA: Diagnosis not present

## 2017-05-09 DIAGNOSIS — Z0000 Encounter for general adult medical examination without abnormal findings: Secondary | ICD-10-CM | POA: Diagnosis not present

## 2017-05-30 DIAGNOSIS — H43392 Other vitreous opacities, left eye: Secondary | ICD-10-CM | POA: Diagnosis not present

## 2017-05-30 DIAGNOSIS — Z961 Presence of intraocular lens: Secondary | ICD-10-CM | POA: Diagnosis not present

## 2017-05-30 DIAGNOSIS — H43812 Vitreous degeneration, left eye: Secondary | ICD-10-CM | POA: Diagnosis not present

## 2017-05-30 DIAGNOSIS — H26492 Other secondary cataract, left eye: Secondary | ICD-10-CM | POA: Diagnosis not present

## 2017-07-18 DIAGNOSIS — N401 Enlarged prostate with lower urinary tract symptoms: Secondary | ICD-10-CM | POA: Diagnosis not present

## 2017-07-25 DIAGNOSIS — C61 Malignant neoplasm of prostate: Secondary | ICD-10-CM | POA: Diagnosis not present

## 2017-07-25 DIAGNOSIS — N400 Enlarged prostate without lower urinary tract symptoms: Secondary | ICD-10-CM | POA: Diagnosis not present

## 2017-08-01 ENCOUNTER — Ambulatory Visit: Payer: BLUE CROSS/BLUE SHIELD | Admitting: Physician Assistant

## 2017-08-10 DIAGNOSIS — Z23 Encounter for immunization: Secondary | ICD-10-CM | POA: Diagnosis not present

## 2017-09-09 DIAGNOSIS — Z23 Encounter for immunization: Secondary | ICD-10-CM | POA: Diagnosis not present

## 2017-10-07 DIAGNOSIS — E079 Disorder of thyroid, unspecified: Secondary | ICD-10-CM | POA: Diagnosis not present

## 2017-10-07 DIAGNOSIS — M170 Bilateral primary osteoarthritis of knee: Secondary | ICD-10-CM | POA: Diagnosis not present

## 2017-10-07 DIAGNOSIS — Z96652 Presence of left artificial knee joint: Secondary | ICD-10-CM | POA: Diagnosis not present

## 2017-10-07 DIAGNOSIS — G4730 Sleep apnea, unspecified: Secondary | ICD-10-CM | POA: Diagnosis not present

## 2017-10-07 DIAGNOSIS — K219 Gastro-esophageal reflux disease without esophagitis: Secondary | ICD-10-CM | POA: Diagnosis not present

## 2017-10-07 DIAGNOSIS — M1711 Unilateral primary osteoarthritis, right knee: Secondary | ICD-10-CM | POA: Diagnosis not present

## 2017-10-07 DIAGNOSIS — I10 Essential (primary) hypertension: Secondary | ICD-10-CM | POA: Diagnosis not present

## 2017-10-13 ENCOUNTER — Encounter: Payer: Self-pay | Admitting: Cardiology

## 2017-10-13 ENCOUNTER — Ambulatory Visit (INDEPENDENT_AMBULATORY_CARE_PROVIDER_SITE_OTHER): Payer: BLUE CROSS/BLUE SHIELD | Admitting: Cardiology

## 2017-10-13 VITALS — BP 151/84 | HR 68 | Ht 70.0 in | Wt 282.0 lb

## 2017-10-13 DIAGNOSIS — Z01810 Encounter for preprocedural cardiovascular examination: Secondary | ICD-10-CM | POA: Diagnosis not present

## 2017-10-13 DIAGNOSIS — M1710 Unilateral primary osteoarthritis, unspecified knee: Secondary | ICD-10-CM | POA: Insufficient documentation

## 2017-10-13 DIAGNOSIS — Z6841 Body Mass Index (BMI) 40.0 and over, adult: Secondary | ICD-10-CM | POA: Diagnosis not present

## 2017-10-13 DIAGNOSIS — M179 Osteoarthritis of knee, unspecified: Secondary | ICD-10-CM | POA: Insufficient documentation

## 2017-10-13 DIAGNOSIS — E876 Hypokalemia: Secondary | ICD-10-CM

## 2017-10-13 DIAGNOSIS — M170 Bilateral primary osteoarthritis of knee: Secondary | ICD-10-CM | POA: Diagnosis not present

## 2017-10-13 DIAGNOSIS — R079 Chest pain, unspecified: Secondary | ICD-10-CM | POA: Diagnosis not present

## 2017-10-13 DIAGNOSIS — I10 Essential (primary) hypertension: Secondary | ICD-10-CM

## 2017-10-13 DIAGNOSIS — I4510 Unspecified right bundle-branch block: Secondary | ICD-10-CM | POA: Diagnosis not present

## 2017-10-13 DIAGNOSIS — E785 Hyperlipidemia, unspecified: Secondary | ICD-10-CM | POA: Diagnosis not present

## 2017-10-13 NOTE — Patient Instructions (Signed)
Medication Instructions:  NO CHANGES If you need a refill on your cardiac medications before your next appointment, please call your pharmacy.  Labwork: BMP TODAY HERE IN OUR OFFICE AT [facility]  Special Instructions: CLEARED FOR SURGERY-WE WILL NOTIFY SURGEONS OFFICE  Follow-Up: Your physician wants you to follow-up:  AS NEEDED WITH DR Gwenlyn Found  Thank you for choosing [facility] at [facility]!!

## 2017-10-13 NOTE — Assessment & Plan Note (Signed)
Intolerant to statins in the past

## 2017-10-13 NOTE — Assessment & Plan Note (Signed)
K+ 3.1 on pre op labs- will repeat

## 2017-10-13 NOTE — Assessment & Plan Note (Signed)
BMI 40 

## 2017-10-13 NOTE — Assessment & Plan Note (Signed)
Pt referred for pre op clearance prior to TKR

## 2017-10-13 NOTE — Assessment & Plan Note (Signed)
Incidental finding on EKG- new from March 2018

## 2017-10-13 NOTE — Assessment & Plan Note (Signed)
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
## 2017-10-13 NOTE — Assessment & Plan Note (Signed)
Controlled.  

## 2017-10-14 LAB — BASIC METABOLIC PANEL
BUN/Creatinine Ratio: 14 (ref 10–24)
BUN: 18 mg/dL (ref 8–27)
CO2: 25 mmol/L (ref 20–29)
Calcium: 9.6 mg/dL (ref 8.6–10.2)
Chloride: 102 mmol/L (ref 96–106)
Creatinine, Ser: 1.31 mg/dL — ABNORMAL HIGH (ref 0.76–1.27)
GFR calc Af Amer: 67 mL/min/{1.73_m2} (ref >59)
GFR calc non Af Amer: 58 mL/min/{1.73_m2} — ABNORMAL LOW (ref >59)
Glucose: 84 mg/dL (ref 65–99)
Potassium: 4.6 mmol/L (ref 3.5–5.2)
Sodium: 143 mmol/L (ref 134–144)

## 2017-10-26 ENCOUNTER — Ambulatory Visit: Payer: BLUE CROSS/BLUE SHIELD | Admitting: Cardiovascular Disease

## 2017-10-31 DIAGNOSIS — Z1389 Encounter for screening for other disorder: Secondary | ICD-10-CM | POA: Diagnosis not present

## 2017-10-31 DIAGNOSIS — E1169 Type 2 diabetes mellitus with other specified complication: Secondary | ICD-10-CM | POA: Diagnosis not present

## 2017-10-31 DIAGNOSIS — R894 Abnormal immunological findings in specimens from other organs, systems and tissues: Secondary | ICD-10-CM | POA: Diagnosis not present

## 2017-10-31 DIAGNOSIS — I10 Essential (primary) hypertension: Secondary | ICD-10-CM | POA: Diagnosis not present

## 2017-10-31 DIAGNOSIS — Z0000 Encounter for general adult medical examination without abnormal findings: Secondary | ICD-10-CM | POA: Diagnosis not present

## 2017-10-31 DIAGNOSIS — E782 Mixed hyperlipidemia: Secondary | ICD-10-CM | POA: Diagnosis not present

## 2017-10-31 DIAGNOSIS — B351 Tinea unguium: Secondary | ICD-10-CM | POA: Diagnosis not present

## 2017-11-01 DIAGNOSIS — I10 Essential (primary) hypertension: Secondary | ICD-10-CM | POA: Diagnosis not present

## 2017-11-01 DIAGNOSIS — E669 Obesity, unspecified: Secondary | ICD-10-CM | POA: Diagnosis not present

## 2017-11-01 DIAGNOSIS — Z96652 Presence of left artificial knee joint: Secondary | ICD-10-CM | POA: Diagnosis not present

## 2017-11-01 DIAGNOSIS — E785 Hyperlipidemia, unspecified: Secondary | ICD-10-CM | POA: Diagnosis not present

## 2017-11-01 DIAGNOSIS — G8918 Other acute postprocedural pain: Secondary | ICD-10-CM | POA: Diagnosis not present

## 2017-11-01 DIAGNOSIS — K210 Gastro-esophageal reflux disease with esophagitis: Secondary | ICD-10-CM | POA: Diagnosis not present

## 2017-11-01 DIAGNOSIS — E079 Disorder of thyroid, unspecified: Secondary | ICD-10-CM | POA: Diagnosis not present

## 2017-11-01 DIAGNOSIS — Z6839 Body mass index (BMI) 39.0-39.9, adult: Secondary | ICD-10-CM | POA: Diagnosis not present

## 2017-11-01 DIAGNOSIS — M1711 Unilateral primary osteoarthritis, right knee: Secondary | ICD-10-CM | POA: Diagnosis not present

## 2017-11-01 DIAGNOSIS — G4730 Sleep apnea, unspecified: Secondary | ICD-10-CM | POA: Diagnosis not present

## 2017-11-02 DIAGNOSIS — G8918 Other acute postprocedural pain: Secondary | ICD-10-CM | POA: Diagnosis not present

## 2017-11-08 DIAGNOSIS — M1711 Unilateral primary osteoarthritis, right knee: Secondary | ICD-10-CM | POA: Diagnosis not present

## 2017-11-10 DIAGNOSIS — M1711 Unilateral primary osteoarthritis, right knee: Secondary | ICD-10-CM | POA: Diagnosis not present

## 2017-11-12 DIAGNOSIS — M1711 Unilateral primary osteoarthritis, right knee: Secondary | ICD-10-CM | POA: Diagnosis not present

## 2017-11-15 DIAGNOSIS — M1711 Unilateral primary osteoarthritis, right knee: Secondary | ICD-10-CM | POA: Diagnosis not present

## 2017-11-16 DIAGNOSIS — Z96651 Presence of right artificial knee joint: Secondary | ICD-10-CM | POA: Diagnosis not present

## 2017-11-16 DIAGNOSIS — M25561 Pain in right knee: Secondary | ICD-10-CM | POA: Diagnosis not present

## 2017-11-17 DIAGNOSIS — M25561 Pain in right knee: Secondary | ICD-10-CM | POA: Diagnosis not present

## 2017-11-17 DIAGNOSIS — Z96651 Presence of right artificial knee joint: Secondary | ICD-10-CM | POA: Diagnosis not present

## 2017-11-17 DIAGNOSIS — R269 Unspecified abnormalities of gait and mobility: Secondary | ICD-10-CM | POA: Diagnosis not present

## 2017-11-23 DIAGNOSIS — Z96651 Presence of right artificial knee joint: Secondary | ICD-10-CM | POA: Diagnosis not present

## 2017-11-23 DIAGNOSIS — M25561 Pain in right knee: Secondary | ICD-10-CM | POA: Diagnosis not present

## 2017-11-23 DIAGNOSIS — R269 Unspecified abnormalities of gait and mobility: Secondary | ICD-10-CM | POA: Diagnosis not present

## 2017-11-24 DIAGNOSIS — R269 Unspecified abnormalities of gait and mobility: Secondary | ICD-10-CM | POA: Diagnosis not present

## 2017-11-24 DIAGNOSIS — Z96651 Presence of right artificial knee joint: Secondary | ICD-10-CM | POA: Diagnosis not present

## 2017-11-24 DIAGNOSIS — M25561 Pain in right knee: Secondary | ICD-10-CM | POA: Diagnosis not present

## 2017-11-30 DIAGNOSIS — B351 Tinea unguium: Secondary | ICD-10-CM | POA: Diagnosis not present

## 2018-01-09 DIAGNOSIS — C61 Malignant neoplasm of prostate: Secondary | ICD-10-CM | POA: Diagnosis not present

## 2018-01-16 DIAGNOSIS — N3941 Urge incontinence: Secondary | ICD-10-CM | POA: Diagnosis not present

## 2018-01-16 DIAGNOSIS — R972 Elevated prostate specific antigen [PSA]: Secondary | ICD-10-CM | POA: Diagnosis not present

## 2018-01-16 DIAGNOSIS — N401 Enlarged prostate with lower urinary tract symptoms: Secondary | ICD-10-CM | POA: Diagnosis not present

## 2018-01-16 DIAGNOSIS — C61 Malignant neoplasm of prostate: Secondary | ICD-10-CM | POA: Diagnosis not present

## 2018-03-02 DIAGNOSIS — C61 Malignant neoplasm of prostate: Secondary | ICD-10-CM | POA: Diagnosis not present

## 2018-03-06 DIAGNOSIS — C61 Malignant neoplasm of prostate: Secondary | ICD-10-CM | POA: Diagnosis not present

## 2018-03-17 DIAGNOSIS — C61 Malignant neoplasm of prostate: Secondary | ICD-10-CM | POA: Diagnosis not present

## 2018-03-17 DIAGNOSIS — R3915 Urgency of urination: Secondary | ICD-10-CM | POA: Diagnosis not present

## 2018-03-17 DIAGNOSIS — R350 Frequency of micturition: Secondary | ICD-10-CM | POA: Diagnosis not present

## 2018-03-17 DIAGNOSIS — N401 Enlarged prostate with lower urinary tract symptoms: Secondary | ICD-10-CM | POA: Diagnosis not present

## 2018-03-30 DIAGNOSIS — N452 Orchitis: Secondary | ICD-10-CM | POA: Diagnosis not present

## 2018-04-05 DIAGNOSIS — L821 Other seborrheic keratosis: Secondary | ICD-10-CM | POA: Diagnosis not present

## 2018-04-05 DIAGNOSIS — L2084 Intrinsic (allergic) eczema: Secondary | ICD-10-CM | POA: Diagnosis not present

## 2018-04-10 DIAGNOSIS — Z8042 Family history of malignant neoplasm of prostate: Secondary | ICD-10-CM | POA: Diagnosis not present

## 2018-04-10 DIAGNOSIS — R972 Elevated prostate specific antigen [PSA]: Secondary | ICD-10-CM | POA: Diagnosis not present

## 2018-04-10 DIAGNOSIS — N401 Enlarged prostate with lower urinary tract symptoms: Secondary | ICD-10-CM | POA: Diagnosis not present

## 2018-04-10 DIAGNOSIS — C61 Malignant neoplasm of prostate: Secondary | ICD-10-CM | POA: Diagnosis not present

## 2018-04-10 DIAGNOSIS — Z79899 Other long term (current) drug therapy: Secondary | ICD-10-CM | POA: Diagnosis not present

## 2018-04-10 DIAGNOSIS — Z800 Family history of malignant neoplasm of digestive organs: Secondary | ICD-10-CM | POA: Diagnosis not present

## 2018-04-10 DIAGNOSIS — N529 Male erectile dysfunction, unspecified: Secondary | ICD-10-CM | POA: Diagnosis not present

## 2018-04-10 DIAGNOSIS — E785 Hyperlipidemia, unspecified: Secondary | ICD-10-CM | POA: Diagnosis not present

## 2018-04-10 DIAGNOSIS — I10 Essential (primary) hypertension: Secondary | ICD-10-CM | POA: Diagnosis not present

## 2018-04-10 DIAGNOSIS — Z86010 Personal history of colonic polyps: Secondary | ICD-10-CM | POA: Diagnosis not present

## 2018-04-24 DIAGNOSIS — C61 Malignant neoplasm of prostate: Secondary | ICD-10-CM | POA: Diagnosis not present

## 2018-04-24 DIAGNOSIS — Z7183 Encounter for nonprocreative genetic counseling: Secondary | ICD-10-CM | POA: Diagnosis not present

## 2018-04-24 DIAGNOSIS — Z809 Family history of malignant neoplasm, unspecified: Secondary | ICD-10-CM | POA: Diagnosis not present

## 2018-04-24 DIAGNOSIS — Z800 Family history of malignant neoplasm of digestive organs: Secondary | ICD-10-CM | POA: Diagnosis not present

## 2018-04-24 DIAGNOSIS — Z8042 Family history of malignant neoplasm of prostate: Secondary | ICD-10-CM | POA: Diagnosis not present

## 2018-04-27 ENCOUNTER — Encounter: Payer: Self-pay | Admitting: *Deleted

## 2018-04-28 DIAGNOSIS — C61 Malignant neoplasm of prostate: Secondary | ICD-10-CM | POA: Diagnosis not present

## 2018-05-15 DIAGNOSIS — I10 Essential (primary) hypertension: Secondary | ICD-10-CM | POA: Diagnosis not present

## 2018-05-15 DIAGNOSIS — Z0000 Encounter for general adult medical examination without abnormal findings: Secondary | ICD-10-CM | POA: Diagnosis not present

## 2018-05-15 DIAGNOSIS — E039 Hypothyroidism, unspecified: Secondary | ICD-10-CM | POA: Diagnosis not present

## 2018-05-15 DIAGNOSIS — Z125 Encounter for screening for malignant neoplasm of prostate: Secondary | ICD-10-CM | POA: Diagnosis not present

## 2018-05-25 DIAGNOSIS — R310 Gross hematuria: Secondary | ICD-10-CM | POA: Diagnosis not present

## 2018-05-25 DIAGNOSIS — N50812 Left testicular pain: Secondary | ICD-10-CM | POA: Diagnosis not present

## 2018-05-28 ENCOUNTER — Encounter: Payer: Self-pay | Admitting: Internal Medicine

## 2018-06-05 DIAGNOSIS — H11152 Pinguecula, left eye: Secondary | ICD-10-CM | POA: Diagnosis not present

## 2018-06-05 DIAGNOSIS — Z961 Presence of intraocular lens: Secondary | ICD-10-CM | POA: Diagnosis not present

## 2018-06-05 DIAGNOSIS — H15102 Unspecified episcleritis, left eye: Secondary | ICD-10-CM | POA: Diagnosis not present

## 2018-06-07 ENCOUNTER — Ambulatory Visit: Payer: BLUE CROSS/BLUE SHIELD | Admitting: Nurse Practitioner

## 2018-06-09 ENCOUNTER — Ambulatory Visit: Payer: BLUE CROSS/BLUE SHIELD | Admitting: Nurse Practitioner

## 2018-06-09 ENCOUNTER — Encounter: Payer: Self-pay | Admitting: Nurse Practitioner

## 2018-06-09 VITALS — BP 110/70 | HR 80 | Ht 68.0 in | Wt 299.8 lb

## 2018-06-09 DIAGNOSIS — Z86010 Personal history of colonic polyps: Secondary | ICD-10-CM | POA: Diagnosis not present

## 2018-06-09 DIAGNOSIS — K5900 Constipation, unspecified: Secondary | ICD-10-CM | POA: Diagnosis not present

## 2018-06-09 MED ORDER — NA SULFATE-K SULFATE-MG SULF 17.5-3.13-1.6 GM/177ML PO SOLN: 1.0000 | ORAL | 0 refills | Status: DC

## 2018-06-09 NOTE — Progress Notes (Addendum)
Chief Complaint: constipation    ASSESSMENT AND PLAN;   1. 62 yo male with constipation / gas. He gives a hx of chronic IBS. Since making this appt patient starting juicing and constipation has resolved. He takes daily fiber and stool softener as well. He doesn't want to change to change bowel regimen at this point.   2. Personal history of adenomatous colon polyps and [facility] of colon cancer in father age 64. Patient is due next month for surveillance colonoscopy.  -The risks and benefits of colonoscopy with possible polypectomy were discussed and the patient agrees to proceed.   3. Morbid obesity. BMI 45.5. Weight up 17 pounds since November. Has hypothyroidism, on replacement hormone.  -Will hold off on checking thyroid studies, I assume PCP is monitoring.   Addendum: Reviewed and agree with initial management. Pyrtle, Lajuan Lines, MD     HPI:    Patient is a 62 year old male previously followed by Dr. Sharlett Iles, now Dr. Hilarie Fredrickson.  He is s/p remote partial colectomy for diverticulitis. He has a hx of adenomatous colon polyps and father died at 48 from colon cancer.  Last seen in office 2013 but underwent surveillance colonoscopy with polypectomy August 2016.  Findings included several small polyps, moderate diverticulosis of the left colon and a small lipoma in the proximal transverse colon. Polyp pathology compatible with sessile serrated polyps and tubular adenomas.  No cytologic dysplasia / no high invasive malignancy.  Mr. Kronick is here with a 3-4 month history of IBS type symptoms with gas and constipation associated with muscles spasms in LLQ. He has had these associated LLQ muscle spasms for years. No blood in stool. Taking daly metamucil and colace as needed. On daily Mg+, sometimes up to a 1000 mg a day for "muscles problems related to Cipro" . He starting juicing a month of so ago and constipation has significantly improved. Stools are not as hard and having BMs a couple of times  a day. This appointment was made prior to symptoms improving and since better now he doesn't want to change regimen.  Received letter for recall colonoscopy in mail yesterday.   ROS: No chest pain.  No shortness of breath.  No urinary symptoms.  No fevers.  Past Medical History:  Diagnosis Date  . Diverticular disease   . Diverticulosis of colon (without mention of hemorrhage)   . Esophageal reflux   . Family history of malignant neoplasm of gastrointestinal tract   . Hyperlipemia   . Hypertension   . Obesity   . Personal history of colonic polyps 06/29/2010   hyperplastic  . Sleep apnea    uses CPAP  . Thyroid disease      Family History  Problem Relation Age of Onset  . Colon cancer Father 1  . Allergies Mother   . Allergies Sister   . Allergies Daughter   . Esophageal cancer Neg Hx   . Pancreatic cancer Neg Hx   . Stomach cancer Neg Hx   . Liver disease Neg Hx     Current Outpatient Medications  Medication Sig Dispense Refill  . aspirin 81 MG tablet Take 81 mg by mouth daily.    . calcium carbonate (TUMS - DOSED IN MG ELEMENTAL CALCIUM) 500 MG chewable tablet Chew 1 tablet by mouth as needed for indigestion or heartburn.    . Cholecalciferol (VITAMIN D3) 5000 units TABS Take 1 tablet by mouth daily.    . Coenzyme Q10 (CO Q 10) 10 MG CAPS  Take 1 capsule by mouth daily.    Marland Kitchen diltiazem (TIAZAC) 180 MG 24 hr capsule Take 180 mg by mouth daily.    Marland Kitchen levothyroxine (SYNTHROID, LEVOTHROID) 125 MCG tablet Take 125 mcg by mouth daily before breakfast.    . magnesium gluconate (MAGONATE) 500 MG tablet Take 500 mg by mouth daily.     No current facility-administered medications for this visit.    Allergies  Allergen Reactions  . Ciprofloxacin     Neuropathy, tendon damage  . Doxycycline     REACTION: hives  . Statins      Creatinine clearance cannot be calculated (Patient's most recent lab result is older than the maximum 21 days allowed.)   Physical Exam:    Wt  Readings from Last 3 Encounters:  06/09/18 299 lb 12.8 oz (136 kg)  10/13/17 282 lb (127.9 kg)  06/23/16 (!) 305 lb (138.3 kg)    BP 110/70   Pulse 80   Ht 5\' 8"  (1.727 m)   Wt 299 lb 12.8 oz (136 kg)   BMI 45.58 kg/m   Constitutional:  Pleasant obese male in no acute distress. Psychiatric: Normal mood and affect. Behavior is normal. EENT: Pupils normal.  Conjunctivae are normal. No scleral icterus. Neck supple.  Cardiovascular: Normal rate, regular rhythm. No edema Pulmonary/chest: Effort normal and breath sounds normal. No wheezing, rales or rhonchi. Abdominal: Soft, nondistended, nontender. Bowel sounds active throughout. There are no masses palpable.  Neurological: Alert and oriented to person place and time. Skin: Skin is warm and dry. No rashes noted.  Tye Savoy, NP  06/09/2018, 10:34 AM  Cc: Orpah Melter, MD

## 2018-06-09 NOTE — Patient Instructions (Signed)

## 2018-06-19 IMAGING — NM NM MISC PROCEDURE
6 series · 36 of 36 positions shown · non-contrast
Comparison: none

[Series 1: wbr stress-gsp · 6.40mm/px · 6 of 510 frames shown]
[frame 43/510  full-range]
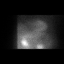
[frame 128/510  full-range]
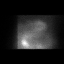
[frame 213/510  full-range]
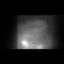
[frame 298/510  full-range]
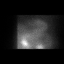
[frame 383/510  full-range]
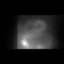
[frame 468/510  full-range]
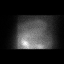

[Series 1: wbr_s-proj_st wbr stress-gsp · 6.40mm/px · 6 of 512 frames shown]
[frame 43/512]
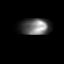
[frame 128/512]
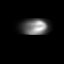
[frame 214/512]
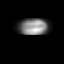
[frame 299/512]
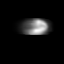
[frame 384/512]
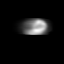
[frame 470/512]
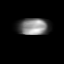

[Series 2: wbr stress-sum-em · 6.40mm/px · 6 of 64 frames shown]
[frame 6/64]
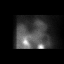
[frame 16/64]
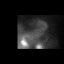
[frame 27/64]
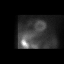
[frame 38/64]
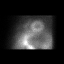
[frame 48/64]
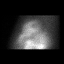
[frame 59/64]
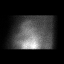

[Series 2: wbr_s-proj_st wbr stress-sum-em · 6.40mm/px · 6 of 64 frames shown]
[frame 6/64]
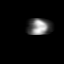
[frame 16/64]
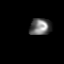
[frame 27/64]
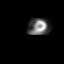
[frame 38/64]
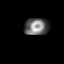
[frame 48/64]
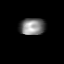
[frame 59/64]
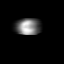

[Series 3: wbr_r-proj_st wbr rest · 6.40mm/px · 6 of 64 frames shown]
[frame 6/64]
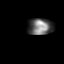
[frame 16/64]
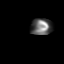
[frame 27/64]
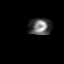
[frame 38/64]
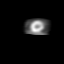
[frame 48/64]
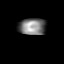
[frame 59/64]
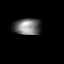

[Series 3: wbr rest · 6.40mm/px · 6 of 64 frames shown]
[frame 6/64]
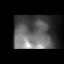
[frame 16/64]
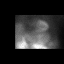
[frame 27/64]
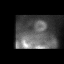
[frame 38/64]
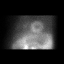
[frame 48/64]
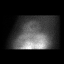
[frame 59/64]
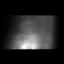

[36 of 36 positions shown; findings below may reference images not displayed]

Canned report from images found in remote index.

Refer to host system for actual result text.

## 2018-07-05 DIAGNOSIS — C61 Malignant neoplasm of prostate: Secondary | ICD-10-CM | POA: Diagnosis not present

## 2018-07-05 DIAGNOSIS — G4733 Obstructive sleep apnea (adult) (pediatric): Secondary | ICD-10-CM | POA: Diagnosis not present

## 2018-07-05 DIAGNOSIS — Z720 Tobacco use: Secondary | ICD-10-CM | POA: Diagnosis not present

## 2018-07-05 DIAGNOSIS — Z79899 Other long term (current) drug therapy: Secondary | ICD-10-CM | POA: Diagnosis not present

## 2018-07-05 DIAGNOSIS — I10 Essential (primary) hypertension: Secondary | ICD-10-CM | POA: Diagnosis not present

## 2018-07-05 DIAGNOSIS — E785 Hyperlipidemia, unspecified: Secondary | ICD-10-CM | POA: Diagnosis not present

## 2018-07-10 DIAGNOSIS — Z510 Encounter for antineoplastic radiation therapy: Secondary | ICD-10-CM | POA: Diagnosis not present

## 2018-07-10 DIAGNOSIS — C61 Malignant neoplasm of prostate: Secondary | ICD-10-CM | POA: Diagnosis not present

## 2018-07-13 DIAGNOSIS — M25462 Effusion, left knee: Secondary | ICD-10-CM | POA: Diagnosis not present

## 2018-07-13 DIAGNOSIS — M25562 Pain in left knee: Secondary | ICD-10-CM | POA: Diagnosis not present

## 2018-07-13 DIAGNOSIS — G8929 Other chronic pain: Secondary | ICD-10-CM | POA: Diagnosis not present

## 2018-07-14 DIAGNOSIS — C61 Malignant neoplasm of prostate: Secondary | ICD-10-CM | POA: Diagnosis not present

## 2018-07-14 DIAGNOSIS — Z510 Encounter for antineoplastic radiation therapy: Secondary | ICD-10-CM | POA: Diagnosis not present

## 2018-07-15 DIAGNOSIS — M25562 Pain in left knee: Secondary | ICD-10-CM | POA: Diagnosis not present

## 2018-07-18 DIAGNOSIS — Z510 Encounter for antineoplastic radiation therapy: Secondary | ICD-10-CM | POA: Diagnosis not present

## 2018-07-18 DIAGNOSIS — C61 Malignant neoplasm of prostate: Secondary | ICD-10-CM | POA: Diagnosis not present

## 2018-07-19 DIAGNOSIS — C61 Malignant neoplasm of prostate: Secondary | ICD-10-CM | POA: Diagnosis not present

## 2018-07-19 DIAGNOSIS — Z510 Encounter for antineoplastic radiation therapy: Secondary | ICD-10-CM | POA: Diagnosis not present

## 2018-07-20 DIAGNOSIS — Z510 Encounter for antineoplastic radiation therapy: Secondary | ICD-10-CM | POA: Diagnosis not present

## 2018-07-20 DIAGNOSIS — C61 Malignant neoplasm of prostate: Secondary | ICD-10-CM | POA: Diagnosis not present

## 2018-07-21 DIAGNOSIS — Z510 Encounter for antineoplastic radiation therapy: Secondary | ICD-10-CM | POA: Diagnosis not present

## 2018-07-21 DIAGNOSIS — C61 Malignant neoplasm of prostate: Secondary | ICD-10-CM | POA: Diagnosis not present

## 2018-07-24 DIAGNOSIS — Z510 Encounter for antineoplastic radiation therapy: Secondary | ICD-10-CM | POA: Diagnosis not present

## 2018-07-24 DIAGNOSIS — C61 Malignant neoplasm of prostate: Secondary | ICD-10-CM | POA: Diagnosis not present

## 2018-07-25 DIAGNOSIS — Z510 Encounter for antineoplastic radiation therapy: Secondary | ICD-10-CM | POA: Diagnosis not present

## 2018-07-25 DIAGNOSIS — C61 Malignant neoplasm of prostate: Secondary | ICD-10-CM | POA: Diagnosis not present

## 2018-07-26 DIAGNOSIS — R3912 Poor urinary stream: Secondary | ICD-10-CM | POA: Diagnosis not present

## 2018-07-26 DIAGNOSIS — Z510 Encounter for antineoplastic radiation therapy: Secondary | ICD-10-CM | POA: Diagnosis not present

## 2018-07-26 DIAGNOSIS — C61 Malignant neoplasm of prostate: Secondary | ICD-10-CM | POA: Diagnosis not present

## 2018-07-27 DIAGNOSIS — Z510 Encounter for antineoplastic radiation therapy: Secondary | ICD-10-CM | POA: Diagnosis not present

## 2018-07-27 DIAGNOSIS — C61 Malignant neoplasm of prostate: Secondary | ICD-10-CM | POA: Diagnosis not present

## 2018-07-28 DIAGNOSIS — C61 Malignant neoplasm of prostate: Secondary | ICD-10-CM | POA: Diagnosis not present

## 2018-07-28 DIAGNOSIS — Z510 Encounter for antineoplastic radiation therapy: Secondary | ICD-10-CM | POA: Diagnosis not present

## 2018-07-31 DIAGNOSIS — Z510 Encounter for antineoplastic radiation therapy: Secondary | ICD-10-CM | POA: Diagnosis not present

## 2018-07-31 DIAGNOSIS — C61 Malignant neoplasm of prostate: Secondary | ICD-10-CM | POA: Diagnosis not present

## 2018-08-01 DIAGNOSIS — C61 Malignant neoplasm of prostate: Secondary | ICD-10-CM | POA: Diagnosis not present

## 2018-08-01 DIAGNOSIS — Z510 Encounter for antineoplastic radiation therapy: Secondary | ICD-10-CM | POA: Diagnosis not present

## 2018-08-02 DIAGNOSIS — Z510 Encounter for antineoplastic radiation therapy: Secondary | ICD-10-CM | POA: Diagnosis not present

## 2018-08-02 DIAGNOSIS — C61 Malignant neoplasm of prostate: Secondary | ICD-10-CM | POA: Diagnosis not present

## 2018-08-03 DIAGNOSIS — C61 Malignant neoplasm of prostate: Secondary | ICD-10-CM | POA: Diagnosis not present

## 2018-08-03 DIAGNOSIS — Z510 Encounter for antineoplastic radiation therapy: Secondary | ICD-10-CM | POA: Diagnosis not present

## 2018-08-04 DIAGNOSIS — Z510 Encounter for antineoplastic radiation therapy: Secondary | ICD-10-CM | POA: Diagnosis not present

## 2018-08-04 DIAGNOSIS — C61 Malignant neoplasm of prostate: Secondary | ICD-10-CM | POA: Diagnosis not present

## 2018-08-08 DIAGNOSIS — Z510 Encounter for antineoplastic radiation therapy: Secondary | ICD-10-CM | POA: Diagnosis not present

## 2018-08-08 DIAGNOSIS — C61 Malignant neoplasm of prostate: Secondary | ICD-10-CM | POA: Diagnosis not present

## 2018-08-09 DIAGNOSIS — Z510 Encounter for antineoplastic radiation therapy: Secondary | ICD-10-CM | POA: Diagnosis not present

## 2018-08-09 DIAGNOSIS — C61 Malignant neoplasm of prostate: Secondary | ICD-10-CM | POA: Diagnosis not present

## 2018-08-10 DIAGNOSIS — Z510 Encounter for antineoplastic radiation therapy: Secondary | ICD-10-CM | POA: Diagnosis not present

## 2018-08-10 DIAGNOSIS — C61 Malignant neoplasm of prostate: Secondary | ICD-10-CM | POA: Diagnosis not present

## 2018-08-11 DIAGNOSIS — C61 Malignant neoplasm of prostate: Secondary | ICD-10-CM | POA: Diagnosis not present

## 2018-08-11 DIAGNOSIS — Z510 Encounter for antineoplastic radiation therapy: Secondary | ICD-10-CM | POA: Diagnosis not present

## 2018-08-14 DIAGNOSIS — C61 Malignant neoplasm of prostate: Secondary | ICD-10-CM | POA: Diagnosis not present

## 2018-08-14 DIAGNOSIS — Z510 Encounter for antineoplastic radiation therapy: Secondary | ICD-10-CM | POA: Diagnosis not present

## 2018-08-15 ENCOUNTER — Encounter: Payer: Self-pay | Admitting: Internal Medicine

## 2018-08-15 DIAGNOSIS — C61 Malignant neoplasm of prostate: Secondary | ICD-10-CM | POA: Diagnosis not present

## 2018-08-15 DIAGNOSIS — Z510 Encounter for antineoplastic radiation therapy: Secondary | ICD-10-CM | POA: Diagnosis not present

## 2018-08-16 DIAGNOSIS — C61 Malignant neoplasm of prostate: Secondary | ICD-10-CM | POA: Diagnosis not present

## 2018-08-16 DIAGNOSIS — Z510 Encounter for antineoplastic radiation therapy: Secondary | ICD-10-CM | POA: Diagnosis not present

## 2018-08-17 DIAGNOSIS — C61 Malignant neoplasm of prostate: Secondary | ICD-10-CM | POA: Diagnosis not present

## 2018-08-17 DIAGNOSIS — Z510 Encounter for antineoplastic radiation therapy: Secondary | ICD-10-CM | POA: Diagnosis not present

## 2018-08-18 DIAGNOSIS — Z510 Encounter for antineoplastic radiation therapy: Secondary | ICD-10-CM | POA: Diagnosis not present

## 2018-08-18 DIAGNOSIS — C61 Malignant neoplasm of prostate: Secondary | ICD-10-CM | POA: Diagnosis not present

## 2018-08-21 ENCOUNTER — Encounter: Payer: Self-pay | Admitting: Internal Medicine

## 2018-08-21 ENCOUNTER — Ambulatory Visit (AMBULATORY_SURGERY_CENTER): Payer: BLUE CROSS/BLUE SHIELD | Admitting: Internal Medicine

## 2018-08-21 VITALS — BP 124/75 | HR 64 | Temp 97.1°F | Resp 13 | Ht 68.0 in | Wt 299.0 lb

## 2018-08-21 DIAGNOSIS — D123 Benign neoplasm of transverse colon: Secondary | ICD-10-CM

## 2018-08-21 DIAGNOSIS — C61 Malignant neoplasm of prostate: Secondary | ICD-10-CM | POA: Diagnosis not present

## 2018-08-21 DIAGNOSIS — D122 Benign neoplasm of ascending colon: Secondary | ICD-10-CM

## 2018-08-21 DIAGNOSIS — Z860100 Personal history of colon polyps, unspecified: Secondary | ICD-10-CM

## 2018-08-21 DIAGNOSIS — Z800 Family history of malignant neoplasm of digestive organs: Secondary | ICD-10-CM

## 2018-08-21 DIAGNOSIS — Z510 Encounter for antineoplastic radiation therapy: Secondary | ICD-10-CM | POA: Diagnosis not present

## 2018-08-21 MED ORDER — SODIUM CHLORIDE 0.9 % IV SOLN: 500.0000 mL | Freq: Once | INTRAVENOUS | Status: DC

## 2018-08-21 NOTE — Progress Notes (Signed)
Report given to PACU, vss 

## 2018-08-21 NOTE — Patient Instructions (Signed)
**   Handouts given on polyps, diverticulosis and hemorrhoids  YOU HAD AN ENDOSCOPIC PROCEDURE TODAY AT THE [facility]:   Refer to the procedure report that was given to you for any specific questions about what was found during the examination.  If the procedure report does not answer your questions, please call your gastroenterologist to clarify.  If you requested that your care partner not be given the details of your procedure findings, then the procedure report has been included in a sealed envelope for you to review at your convenience later.  YOU SHOULD EXPECT: Some feelings of bloating in the abdomen. Passage of more gas than usual.  Walking can help get rid of the air that was put into your GI tract during the procedure and reduce the bloating. If you had a lower endoscopy (such as a colonoscopy or flexible sigmoidoscopy) you may notice spotting of blood in your stool or on the toilet paper. If you underwent a bowel prep for your procedure, you may not have a normal bowel movement for a few days.  Please Note:  You might notice some irritation and congestion in your nose or some drainage.  This is from the oxygen used during your procedure.  There is no need for concern and it should clear up in a day or so.  SYMPTOMS TO REPORT IMMEDIATELY:   Following lower endoscopy (colonoscopy or flexible sigmoidoscopy):  Excessive amounts of blood in the stool  Significant tenderness or worsening of abdominal pains  Swelling of the abdomen that is new, acute  Fever of 100F or higher   For urgent or emergent issues, a gastroenterologist can be reached at any hour by calling [phone].   DIET:  We do recommend a small meal at first, but then you may proceed to your regular diet.  Drink plenty of fluids but you should avoid alcoholic beverages for 24 hours.  ACTIVITY:  You should plan to take it easy for the rest of today and you should NOT DRIVE or use heavy machinery until tomorrow  (because of the sedation medicines used during the test).    FOLLOW UP: Our staff will call the number listed on your records the next business day following your procedure to check on you and address any questions or concerns that you may have regarding the information given to you following your procedure. If we do not reach you, we will leave a message.  However, if you are feeling well and you are not experiencing any problems, there is no need to return our call.  We will assume that you have returned to your regular daily activities without incident.  If any biopsies were taken you will be contacted by phone or by letter within the next 1-3 weeks.  Please call us at [phone] if you have not heard about the biopsies in 3 weeks.    SIGNATURES/CONFIDENTIALITY: You and/or your care partner have signed paperwork which will be entered into your electronic medical record.  These signatures attest to the fact that that the information above on your After Visit Summary has been reviewed and is understood.  Full responsibility of the confidentiality of this discharge information lies with you and/or your care-partner. 

## 2018-08-21 NOTE — Op Note (Signed)
[facility] Patient Name: [name] [name] Procedure Date: 08/21/2018 9:36 AM MRN: [id number] Endoscopist: Jerene Bears , MD Age: 62 Referring MD:  Date of Birth: May 25, 1956 Gender: Male Account #: [id number] Procedure:                Colonoscopy Indications:              Surveillance: Personal history of adenomatous                            polyps on last colonoscopy 3 years ago, Family                            history of colon cancer in a first-degree relative,                            history of prior segmental sigmoid resection for                            recurrent diverticulitis Medicines:                Monitored Anesthesia Care Procedure:                Pre-Anesthesia Assessment:                           - Prior to the procedure, a History and Physical                            was performed, and patient medications and                            allergies were reviewed. The patient's tolerance of                            previous anesthesia was also reviewed. The risks                            and benefits of the procedure and the sedation                            options and risks were discussed with the patient.                            All questions were answered, and informed consent                            was obtained. Prior Anticoagulants: The patient has                            taken no previous anticoagulant or antiplatelet                            agents. ASA Grade Assessment: III - A patient with  severe systemic disease. After reviewing the risks                            and benefits, the patient was deemed in                            satisfactory condition to undergo the procedure.                           After obtaining informed consent, the colonoscope                            was passed under direct vision. Throughout the                            procedure, the patient's blood pressure,  pulse, and                            oxygen saturations were monitored continuously. The                            Colonoscope was introduced through the anus and                            advanced to the cecum, identified by appendiceal                            orifice and ileocecal valve. The colonoscopy was                            performed without difficulty. The patient tolerated                            the procedure well. The quality of the bowel                            preparation was good. The ileocecal valve,                            appendiceal orifice, and rectum were photographed. Scope In: 9:39:04 AM Scope Out: 9:59:46 AM Scope Withdrawal Time: 0 hours 17 minutes 43 seconds  Total Procedure Duration: 0 hours 20 minutes 42 seconds  Findings:                 The digital rectal exam was normal.                           A 6 mm polyp was found in the ascending colon. The                            polyp was sessile. The polyp was removed with a                            cold snare. Resection and retrieval  were complete.                           Two sessile polyps were found in the transverse                            colon. The polyps were 5 to 6 mm in size. These                            polyps were removed with a cold snare. Resection                            and retrieval were complete.                           Multiple small-mouthed diverticula were found in                            the descending colon.                           There was evidence of a prior end-to-end                            colo-colonic anastomosis in the sigmoid colon. This                            was patent and was characterized by healthy                            appearing mucosa.                           Internal hemorrhoids were found during                            retroflexion. The hemorrhoids were small. Complications:            No immediate  complications. Estimated Blood Loss:     Estimated blood loss was minimal. Impression:               - One 6 mm polyp in the ascending colon, removed                            with a cold snare. Resected and retrieved.                           - Two 5 to 6 mm polyps in the transverse colon,                            removed with a cold snare. Resected and retrieved.                           - Diverticulosis in the descending colon.                           -  Patent end-to-end colo-colonic anastomosis,                            characterized by healthy appearing mucosa.                           - Internal hemorrhoids. Recommendation:           - Patient has a contact number available for                            emergencies. The signs and symptoms of potential                            delayed complications were discussed with the                            patient. Return to normal activities tomorrow.                            Written discharge instructions were provided to the                            patient.                           - Resume previous diet.                           - Continue present medications.                           - Await pathology results.                           - Repeat colonoscopy is recommended for                            surveillance. The colonoscopy date will be                            determined after pathology results from today's                            exam become available for review. Jerene Bears, MD 08/21/2018 10:04:48 AM This report has been signed electronically.

## 2018-08-21 NOTE — Progress Notes (Signed)
Called to room to assist during endoscopic procedure.  Patient ID and intended procedure confirmed with present staff. Received instructions for my participation in the procedure from the performing physician.  

## 2018-08-22 ENCOUNTER — Telehealth: Payer: Self-pay

## 2018-08-22 DIAGNOSIS — C61 Malignant neoplasm of prostate: Secondary | ICD-10-CM | POA: Diagnosis not present

## 2018-08-22 DIAGNOSIS — Z510 Encounter for antineoplastic radiation therapy: Secondary | ICD-10-CM | POA: Diagnosis not present

## 2018-08-22 NOTE — Telephone Encounter (Signed)
  Follow up Call-  Call back number 08/21/2018  Post procedure Call Back phone  # [phone]  Permission to leave phone message Yes  Some recent data might be hidden     Patient questions:  Do you have a fever, pain , or abdominal swelling? No. Pain Score  0 *  Have you tolerated food without any problems? No.  Have you been able to return to your normal activities? Yes.    Do you have any questions about your discharge instructions: Diet   No. Medications  No. Follow up visit  No.  Do you have questions or concerns about your Care? No.  Actions: * If pain score is 4 or above: No action needed, pain <4.

## 2018-08-23 DIAGNOSIS — Z510 Encounter for antineoplastic radiation therapy: Secondary | ICD-10-CM | POA: Diagnosis not present

## 2018-08-23 DIAGNOSIS — C61 Malignant neoplasm of prostate: Secondary | ICD-10-CM | POA: Diagnosis not present

## 2018-08-24 DIAGNOSIS — Z510 Encounter for antineoplastic radiation therapy: Secondary | ICD-10-CM | POA: Diagnosis not present

## 2018-08-24 DIAGNOSIS — C61 Malignant neoplasm of prostate: Secondary | ICD-10-CM | POA: Diagnosis not present

## 2018-08-25 DIAGNOSIS — Z510 Encounter for antineoplastic radiation therapy: Secondary | ICD-10-CM | POA: Diagnosis not present

## 2018-08-25 DIAGNOSIS — C61 Malignant neoplasm of prostate: Secondary | ICD-10-CM | POA: Diagnosis not present

## 2018-08-27 ENCOUNTER — Encounter: Payer: Self-pay | Admitting: Internal Medicine

## 2018-08-30 DIAGNOSIS — Z23 Encounter for immunization: Secondary | ICD-10-CM | POA: Diagnosis not present

## 2018-09-19 DIAGNOSIS — C61 Malignant neoplasm of prostate: Secondary | ICD-10-CM | POA: Diagnosis not present

## 2018-09-19 DIAGNOSIS — Z79899 Other long term (current) drug therapy: Secondary | ICD-10-CM | POA: Diagnosis not present

## 2018-09-19 DIAGNOSIS — Z923 Personal history of irradiation: Secondary | ICD-10-CM | POA: Diagnosis not present

## 2018-10-19 DIAGNOSIS — R350 Frequency of micturition: Secondary | ICD-10-CM | POA: Diagnosis not present

## 2018-10-19 DIAGNOSIS — C61 Malignant neoplasm of prostate: Secondary | ICD-10-CM | POA: Diagnosis not present

## 2018-10-19 DIAGNOSIS — N401 Enlarged prostate with lower urinary tract symptoms: Secondary | ICD-10-CM | POA: Diagnosis not present

## 2018-11-09 DIAGNOSIS — E782 Mixed hyperlipidemia: Secondary | ICD-10-CM | POA: Diagnosis not present

## 2018-11-09 DIAGNOSIS — Z23 Encounter for immunization: Secondary | ICD-10-CM | POA: Diagnosis not present

## 2018-11-09 DIAGNOSIS — Z0000 Encounter for general adult medical examination without abnormal findings: Secondary | ICD-10-CM | POA: Diagnosis not present

## 2018-11-09 DIAGNOSIS — E1169 Type 2 diabetes mellitus with other specified complication: Secondary | ICD-10-CM | POA: Diagnosis not present

## 2018-11-09 DIAGNOSIS — R439 Unspecified disturbances of smell and taste: Secondary | ICD-10-CM | POA: Diagnosis not present

## 2018-11-09 DIAGNOSIS — I10 Essential (primary) hypertension: Secondary | ICD-10-CM | POA: Diagnosis not present

## 2018-11-27 DIAGNOSIS — D125 Benign neoplasm of sigmoid colon: Secondary | ICD-10-CM | POA: Diagnosis not present

## 2018-11-27 DIAGNOSIS — Z86010 Personal history of colonic polyps: Secondary | ICD-10-CM | POA: Diagnosis not present

## 2018-11-27 DIAGNOSIS — D123 Benign neoplasm of transverse colon: Secondary | ICD-10-CM | POA: Diagnosis not present

## 2018-12-14 DIAGNOSIS — J00 Acute nasopharyngitis [common cold]: Secondary | ICD-10-CM | POA: Diagnosis not present

## 2018-12-14 DIAGNOSIS — J3089 Other allergic rhinitis: Secondary | ICD-10-CM | POA: Diagnosis not present

## 2018-12-18 DIAGNOSIS — M170 Bilateral primary osteoarthritis of knee: Secondary | ICD-10-CM | POA: Diagnosis not present

## 2018-12-18 DIAGNOSIS — Z96652 Presence of left artificial knee joint: Secondary | ICD-10-CM | POA: Diagnosis not present

## 2018-12-18 DIAGNOSIS — Z96651 Presence of right artificial knee joint: Secondary | ICD-10-CM | POA: Diagnosis not present

## 2018-12-18 DIAGNOSIS — Z6841 Body Mass Index (BMI) 40.0 and over, adult: Secondary | ICD-10-CM | POA: Diagnosis not present

## 2018-12-18 DIAGNOSIS — Z96653 Presence of artificial knee joint, bilateral: Secondary | ICD-10-CM | POA: Diagnosis not present

## 2019-04-03 ENCOUNTER — Other Ambulatory Visit: Payer: Self-pay | Admitting: Ophthalmology

## 2019-04-03 ENCOUNTER — Encounter (HOSPITAL_COMMUNITY): Payer: Self-pay | Admitting: *Deleted

## 2019-04-03 ENCOUNTER — Other Ambulatory Visit: Payer: Self-pay

## 2019-04-03 DIAGNOSIS — H33012 Retinal detachment with single break, left eye: Secondary | ICD-10-CM | POA: Diagnosis not present

## 2019-04-03 DIAGNOSIS — H31091 Other chorioretinal scars, right eye: Secondary | ICD-10-CM | POA: Diagnosis not present

## 2019-04-03 DIAGNOSIS — H43812 Vitreous degeneration, left eye: Secondary | ICD-10-CM | POA: Diagnosis not present

## 2019-04-03 NOTE — Progress Notes (Addendum)
Mr [name] [name] denies chest pain or shortness of breath. 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  I called and spoke to [name] and informed her that patient has traveled out of state  to [place] and [place] the past 14 days.  Patient nor his family has any symptoms.  [name] said that travel out of state is no longer a question we need to ask, because it is a pandemic.

## 2019-04-04 ENCOUNTER — Ambulatory Visit (HOSPITAL_COMMUNITY): Payer: BLUE CROSS/BLUE SHIELD | Admitting: Anesthesiology

## 2019-04-04 ENCOUNTER — Ambulatory Visit (HOSPITAL_COMMUNITY)
Admission: RE | Admit: 2019-04-04 | Discharge: 2019-04-04 | Disposition: A | Discharge status: Home / Self Care | Payer: BLUE CROSS/BLUE SHIELD | Source: Ambulatory Visit | Attending: Ophthalmology | Admitting: Ophthalmology

## 2019-04-04 ENCOUNTER — Encounter (HOSPITAL_COMMUNITY): Payer: Self-pay

## 2019-04-04 ENCOUNTER — Other Ambulatory Visit: Payer: Self-pay

## 2019-04-04 ENCOUNTER — Encounter (HOSPITAL_COMMUNITY)
Admission: RE | Disposition: A | Discharge status: Home / Self Care | Payer: Self-pay | Source: Ambulatory Visit | Attending: Ophthalmology

## 2019-04-04 DIAGNOSIS — Z7982 Long term (current) use of aspirin: Secondary | ICD-10-CM | POA: Insufficient documentation

## 2019-04-04 DIAGNOSIS — Z881 Allergy status to other antibiotic agents status: Secondary | ICD-10-CM | POA: Insufficient documentation

## 2019-04-04 DIAGNOSIS — Z96653 Presence of artificial knee joint, bilateral: Secondary | ICD-10-CM | POA: Insufficient documentation

## 2019-04-04 DIAGNOSIS — G4730 Sleep apnea, unspecified: Secondary | ICD-10-CM | POA: Insufficient documentation

## 2019-04-04 DIAGNOSIS — Z6841 Body Mass Index (BMI) 40.0 and over, adult: Secondary | ICD-10-CM | POA: Diagnosis not present

## 2019-04-04 DIAGNOSIS — I10 Essential (primary) hypertension: Secondary | ICD-10-CM | POA: Diagnosis not present

## 2019-04-04 DIAGNOSIS — Z79890 Hormone replacement therapy (postmenopausal): Secondary | ICD-10-CM | POA: Diagnosis not present

## 2019-04-04 DIAGNOSIS — E039 Hypothyroidism, unspecified: Secondary | ICD-10-CM | POA: Insufficient documentation

## 2019-04-04 DIAGNOSIS — K219 Gastro-esophageal reflux disease without esophagitis: Secondary | ICD-10-CM | POA: Insufficient documentation

## 2019-04-04 DIAGNOSIS — Z79899 Other long term (current) drug therapy: Secondary | ICD-10-CM | POA: Diagnosis not present

## 2019-04-04 DIAGNOSIS — H3322 Serous retinal detachment, left eye: Secondary | ICD-10-CM | POA: Diagnosis present

## 2019-04-04 DIAGNOSIS — M1990 Unspecified osteoarthritis, unspecified site: Secondary | ICD-10-CM | POA: Insufficient documentation

## 2019-04-04 DIAGNOSIS — Z888 Allergy status to other drugs, medicaments and biological substances status: Secondary | ICD-10-CM | POA: Insufficient documentation

## 2019-04-04 DIAGNOSIS — H33022 Retinal detachment with multiple breaks, left eye: Secondary | ICD-10-CM

## 2019-04-04 HISTORY — DX: Hypothyroidism, unspecified: E03.9

## 2019-04-04 HISTORY — DX: Irritable bowel syndrome, unspecified: K58.9

## 2019-04-04 HISTORY — PX: GAS/FLUID EXCHANGE: SHX5334

## 2019-04-04 HISTORY — PX: PARS PLANA VITRECTOMY: SHX2166

## 2019-04-04 HISTORY — PX: PHOTOCOAGULATION WITH LASER: SHX6027

## 2019-04-04 LAB — BASIC METABOLIC PANEL
Anion gap: 12 (ref 5–15)
BUN: 13 mg/dL (ref 8–23)
CO2: 24 mmol/L (ref 22–32)
Calcium: 9.3 mg/dL (ref 8.9–10.3)
Chloride: 103 mmol/L (ref 98–111)
Creatinine, Ser: 0.94 mg/dL (ref 0.61–1.24)
GFR calc Af Amer: >60 mL/min (ref >60)
GFR calc non Af Amer: >60 mL/min (ref >60)
Glucose, Bld: 96 mg/dL (ref 70–99)
Potassium: 4 mmol/L (ref 3.5–5.1)
Sodium: 139 mmol/L (ref 135–145)

## 2019-04-04 LAB — CBC
HCT: 46 % (ref 39.0–52.0)
Hemoglobin: 15.8 g/dL (ref 13.0–17.0)
MCH: 31.9 pg (ref 26.0–34.0)
MCHC: 34.3 g/dL (ref 30.0–36.0)
MCV: 92.7 fL (ref 80.0–100.0)
Platelets: 151 10*3/uL (ref 150–400)
RBC: 4.96 MIL/uL (ref 4.22–5.81)
RDW: 13.3 % (ref 11.5–15.5)
WBC: 8.1 10*3/uL (ref 4.0–10.5)
nRBC: 0 % (ref 0.0–0.2)

## 2019-04-04 SURGERY — PARS PLANA VITRECTOMY WITH 25 GAUGE
Anesthesia: General | Site: Eye | Laterality: Left

## 2019-04-04 MED ORDER — LIDOCAINE HCL (PF) 2 % IJ SOLN
INTRAMUSCULAR | Status: AC
  Filled 2019-04-04: qty 10

## 2019-04-04 MED ORDER — ATROPINE SULFATE 1 % OP SOLN
1.0000 [drp] | OPHTHALMIC | Status: AC | PRN
  Administered 2019-04-04 (×3): 1 [drp] via OPHTHALMIC
  Filled 2019-04-04: qty 2

## 2019-04-04 MED ORDER — EPHEDRINE SULFATE 50 MG/ML IJ SOLN
INTRAMUSCULAR | Status: DC | PRN
  Administered 2019-04-04: 5 mg via INTRAVENOUS

## 2019-04-04 MED ORDER — TOBRAMYCIN-DEXAMETHASONE 0.3-0.1 % OP OINT
TOPICAL_OINTMENT | OPHTHALMIC | Status: AC
  Filled 2019-04-04: qty 3.5

## 2019-04-04 MED ORDER — SODIUM CHLORIDE 0.9 % IV SOLN
INTRAVENOUS | Status: DC
  Administered 2019-04-04: 13:00:00 via INTRAVENOUS

## 2019-04-04 MED ORDER — NA CHONDROIT SULF-NA HYALURON 40-30 MG/ML IO SOLN
INTRAOCULAR | Status: AC
  Filled 2019-04-04: qty 0.5

## 2019-04-04 MED ORDER — EPINEPHRINE PF 1 MG/ML IJ SOLN
INTRAMUSCULAR | Status: AC
  Filled 2019-04-04: qty 1

## 2019-04-04 MED ORDER — OXYCODONE HCL 5 MG/5ML PO SOLN: 5.0000 mg | Freq: Once | ORAL | Status: DC | PRN

## 2019-04-04 MED ORDER — SODIUM HYALURONATE 10 MG/ML IO SOLN
INTRAOCULAR | Status: AC
  Filled 2019-04-04: qty 0.85

## 2019-04-04 MED ORDER — BSS IO SOLN
INTRAOCULAR | Status: DC | PRN
  Administered 2019-04-04: 15 mL via INTRAOCULAR

## 2019-04-04 MED ORDER — SUCCINYLCHOLINE CHLORIDE 20 MG/ML IJ SOLN
INTRAMUSCULAR | Status: DC | PRN
  Administered 2019-04-04: 140 mg via INTRAVENOUS

## 2019-04-04 MED ORDER — BUPIVACAINE HCL (PF) 0.75 % IJ SOLN
INTRAMUSCULAR | Status: AC
  Filled 2019-04-04: qty 10

## 2019-04-04 MED ORDER — EPINEPHRINE PF 1 MG/ML IJ SOLN
INTRAOCULAR | Status: DC | PRN
  Administered 2019-04-04: 18:00:00 500 mL

## 2019-04-04 MED ORDER — MIDAZOLAM HCL 2 MG/2ML IJ SOLN
INTRAMUSCULAR | Status: AC
  Filled 2019-04-04: qty 2

## 2019-04-04 MED ORDER — PROPOFOL 10 MG/ML IV BOLUS
INTRAVENOUS | Status: DC | PRN
  Administered 2019-04-04: 180 mg via INTRAVENOUS

## 2019-04-04 MED ORDER — SUCCINYLCHOLINE CHLORIDE 200 MG/10ML IV SOSY
PREFILLED_SYRINGE | INTRAVENOUS | Status: AC
  Filled 2019-04-04: qty 10

## 2019-04-04 MED ORDER — TETRACAINE HCL 0.5 % OP SOLN
OPHTHALMIC | Status: AC
  Filled 2019-04-04: qty 4

## 2019-04-04 MED ORDER — BSS PLUS IO SOLN
INTRAOCULAR | Status: AC
  Filled 2019-04-04: qty 500

## 2019-04-04 MED ORDER — MIDAZOLAM HCL 5 MG/5ML IJ SOLN
INTRAMUSCULAR | Status: DC | PRN
  Administered 2019-04-04: 2 mg via INTRAVENOUS

## 2019-04-04 MED ORDER — LIDOCAINE HCL 2 % IJ SOLN
INTRAMUSCULAR | Status: DC | PRN
  Administered 2019-04-04: 18:00:00 6 mL via RETROBULBAR

## 2019-04-04 MED ORDER — DEXAMETHASONE SODIUM PHOSPHATE 10 MG/ML IJ SOLN
INTRAMUSCULAR | Status: AC
  Filled 2019-04-04: qty 1

## 2019-04-04 MED ORDER — FENTANYL CITRATE (PF) 250 MCG/5ML IJ SOLN
INTRAMUSCULAR | Status: AC
  Filled 2019-04-04: qty 5

## 2019-04-04 MED ORDER — CEFAZOLIN SUBCONJUNCTIVAL INJECTION 100 MG/0.5 ML
INJECTION | SUBCONJUNCTIVAL | Status: DC | PRN
  Administered 2019-04-04: 100 mg via SUBCONJUNCTIVAL

## 2019-04-04 MED ORDER — ONDANSETRON HCL 4 MG/2ML IJ SOLN: 4.0000 mg | Freq: Once | INTRAMUSCULAR | Status: DC | PRN

## 2019-04-04 MED ORDER — FENTANYL CITRATE (PF) 100 MCG/2ML IJ SOLN: 25.0000 ug | INTRAMUSCULAR | Status: DC | PRN

## 2019-04-04 MED ORDER — INDOCYANINE GREEN 25 MG IV SOLR
INTRAVENOUS | Status: AC
  Filled 2019-04-04: qty 25

## 2019-04-04 MED ORDER — LIDOCAINE 2% (20 MG/ML) 5 ML SYRINGE
INTRAMUSCULAR | Status: AC
  Filled 2019-04-04: qty 5

## 2019-04-04 MED ORDER — CEFAZOLIN SUBCONJUNCTIVAL INJECTION 100 MG/0.5 ML
200.0000 mg | INJECTION | SUBCONJUNCTIVAL | Status: DC
  Filled 2019-04-04: qty 5

## 2019-04-04 MED ORDER — DEXAMETHASONE SODIUM PHOSPHATE 10 MG/ML IJ SOLN
INTRAMUSCULAR | Status: DC | PRN
  Administered 2019-04-04: 10 mg

## 2019-04-04 MED ORDER — FENTANYL CITRATE (PF) 100 MCG/2ML IJ SOLN
INTRAMUSCULAR | Status: DC | PRN
  Administered 2019-04-04: 100 ug via INTRAVENOUS

## 2019-04-04 MED ORDER — PROPOFOL 10 MG/ML IV BOLUS
INTRAVENOUS | Status: AC
  Filled 2019-04-04: qty 20

## 2019-04-04 MED ORDER — LIDOCAINE 2% (20 MG/ML) 5 ML SYRINGE
INTRAMUSCULAR | Status: DC | PRN
  Administered 2019-04-04: 80 mg via INTRAVENOUS

## 2019-04-04 MED ORDER — BSS IO SOLN
INTRAOCULAR | Status: AC
  Filled 2019-04-04: qty 15

## 2019-04-04 MED ORDER — ONDANSETRON HCL 4 MG/2ML IJ SOLN
INTRAMUSCULAR | Status: DC | PRN
  Administered 2019-04-04: 4 mg via INTRAVENOUS

## 2019-04-04 MED ORDER — HYALURONIDASE HUMAN 150 UNIT/ML IJ SOLN
INTRAMUSCULAR | Status: AC
  Filled 2019-04-04: qty 1

## 2019-04-04 MED ORDER — PHENYLEPHRINE HCL 2.5 % OP SOLN
1.0000 [drp] | OPHTHALMIC | Status: AC | PRN
  Administered 2019-04-04 (×3): 1 [drp] via OPHTHALMIC
  Filled 2019-04-04: qty 2

## 2019-04-04 MED ORDER — DEXAMETHASONE SODIUM PHOSPHATE 10 MG/ML IJ SOLN
INTRAMUSCULAR | Status: DC | PRN
  Administered 2019-04-04: 4 mg via INTRAVENOUS

## 2019-04-04 MED ORDER — LACTATED RINGERS IV SOLN
INTRAVENOUS | Status: DC | PRN
  Administered 2019-04-04: 17:00:00 via INTRAVENOUS

## 2019-04-04 MED ORDER — OXYCODONE HCL 5 MG PO TABS: 5.0000 mg | ORAL_TABLET | Freq: Once | ORAL | Status: DC | PRN

## 2019-04-04 SURGICAL SUPPLY — 73 items
ACCESSORY FRAGMATOME (MISCELLANEOUS) IMPLANT
APL SRG 3 HI ABS STRL LF PLS (MISCELLANEOUS)
APPLICATOR DR MATTHEWS STRL (MISCELLANEOUS) IMPLANT
BLADE 10 SAFETY STRL DISP (BLADE) ×2 IMPLANT
BLADE MVR KNIFE 20G (BLADE) IMPLANT
BNDG EYE OVAL (GAUZE/BANDAGES/DRESSINGS) IMPLANT
CANNULA ANT CHAM MAIN (OPHTHALMIC RELATED) IMPLANT
CANNULA ANTERIOR CHAMBER 27GA (MISCELLANEOUS) IMPLANT
CANNULA DUAL BORE 23G (CANNULA) IMPLANT
CANNULA DUALBORE 25G (CANNULA) IMPLANT
CANNULA VLV SOFT TIP 25G (OPHTHALMIC) ×1 IMPLANT
CANNULA VLV SOFT TIP 25GA (OPHTHALMIC) ×4 IMPLANT
CAUTERY EYE LOW TEMP 1300F FIN (OPHTHALMIC RELATED) IMPLANT
CLSR STERI-STRIP ANTIMIC 1/2X4 (GAUZE/BANDAGES/DRESSINGS) ×2 IMPLANT
CORD BIPOLAR FORCEPS 12FT (ELECTRODE) ×2 IMPLANT
COVER MAYO STAND STRL (DRAPES) IMPLANT
COVER WAND RF STERILE (DRAPES) ×2 IMPLANT
DRAPE HALF SHEET 40X57 (DRAPES) ×2 IMPLANT
DRAPE INCISE 51X51 W/FILM STRL (DRAPES) IMPLANT
DRAPE RETRACTOR (MISCELLANEOUS) ×2 IMPLANT
ELECT BIPOLAR DISP (MISCELLANEOUS) IMPLANT
ERASER HMR WETFIELD 23G BP (MISCELLANEOUS) IMPLANT
FILTER BLUE MILLIPORE (MISCELLANEOUS) IMPLANT
FORCEPS ECKARDT ILM 25G SERR (OPHTHALMIC RELATED) IMPLANT
FORCEPS GRIESHABER ILM 25G A (INSTRUMENTS) IMPLANT
GAS AUTO FILL CONSTEL (OPHTHALMIC)
GAS AUTO FILL CONSTELLATION (OPHTHALMIC) IMPLANT
GLOVE BIO SURGEON STRL SZ7.5 (GLOVE) ×4 IMPLANT
GOWN STRL REUS W/ TWL LRG LVL3 (GOWN DISPOSABLE) ×2 IMPLANT
GOWN STRL REUS W/TWL LRG LVL3 (GOWN DISPOSABLE) ×4
HANDLE PNEUMATIC FOR CONSTEL (OPHTHALMIC) IMPLANT
KIT BASIN OR (CUSTOM PROCEDURE TRAY) ×2 IMPLANT
KIT TURNOVER KIT B (KITS) ×2 IMPLANT
LENS BIOM SUPER VIEW SET DISP (OPHTHALMIC RELATED) ×2 IMPLANT
MICROPICK 25G (MISCELLANEOUS)
NDL 18GX1X1/2 (RX/OR ONLY) (NEEDLE) ×1 IMPLANT
NDL 25GX 5/8IN NON SAFETY (NEEDLE) ×1 IMPLANT
NDL FILTER BLUNT 18X1 1/2 (NEEDLE) IMPLANT
NDL HYPO 25GX1X1/2 BEV (NEEDLE) IMPLANT
NDL HYPO 30X.5 LL (NEEDLE) ×2 IMPLANT
NDL RETROBULBAR 25GX1.5 (NEEDLE) ×1 IMPLANT
NEEDLE 18GX1X1/2 (RX/OR ONLY) (NEEDLE) ×2 IMPLANT
NEEDLE 25GX 5/8IN NON SAFETY (NEEDLE) ×2 IMPLANT
NEEDLE FILTER BLUNT 18X 1/2SAF (NEEDLE)
NEEDLE FILTER BLUNT 18X1 1/2 (NEEDLE) IMPLANT
NEEDLE HYPO 25GX1X1/2 BEV (NEEDLE) IMPLANT
NEEDLE HYPO 30X.5 LL (NEEDLE) ×4 IMPLANT
NEEDLE RETROBULBAR 25GX1.5 (NEEDLE) ×2 IMPLANT
NS IRRIG 1000ML POUR BTL (IV SOLUTION) ×2 IMPLANT
PACK FRAGMATOME (OPHTHALMIC) IMPLANT
PACK VITRECTOMY CUSTOM (CUSTOM PROCEDURE TRAY) ×2 IMPLANT
PAD ARMBOARD 7.5X6 YLW CONV (MISCELLANEOUS) ×4 IMPLANT
PAK PIK VITRECTOMY CVS 25GA (OPHTHALMIC) ×2 IMPLANT
PAK VITRECTOMY PIK 25 GA (OPHTHALMIC RELATED) ×2 IMPLANT
PENCIL BIPOLAR 25GA STR DISP (OPHTHALMIC RELATED) IMPLANT
PICK MICROPICK 25G (MISCELLANEOUS) IMPLANT
PROBE LASER ILLUM FLEX CVD 25G (OPHTHALMIC) IMPLANT
ROLLS DENTAL (MISCELLANEOUS) IMPLANT
SCRAPER DIAMOND 25GA (OPHTHALMIC RELATED) IMPLANT
STOCKINETTE IMPERVIOUS 9X36 MD (GAUZE/BANDAGES/DRESSINGS) ×4 IMPLANT
STOCKINETTE IMPERVIOUS LG (DRAPES) ×4 IMPLANT
STOPCOCK 4 WAY LG BORE MALE ST (IV SETS) IMPLANT
SUT ETHILON 8 0 TG100 8 (SUTURE) IMPLANT
SUT VICRYL 7 0 TG140 8 (SUTURE) IMPLANT
SUT VICRYL 8 0 TG140 8 (SUTURE) IMPLANT
SUT VICRYL ABS 6-0 S29 18IN (SUTURE) IMPLANT
SYR 10ML LL (SYRINGE) IMPLANT
SYR 20CC LL (SYRINGE) ×2 IMPLANT
SYR 5ML LL (SYRINGE) ×2 IMPLANT
SYR TB 1ML LUER SLIP (SYRINGE) IMPLANT
TOWEL NATURAL 6PK STERILE (DISPOSABLE) ×2 IMPLANT
TUBE CONNECTING 12X1/4 (SUCTIONS) IMPLANT
WATER STERILE IRR 1000ML POUR (IV SOLUTION) ×2 IMPLANT

## 2019-04-04 NOTE — Progress Notes (Addendum)
Spoke with Dr. Nyoka Cowden- aware EKG shows incomplete Right BBB, similar EKG in care everywhere.  Attempted to call Dr. Nyoka Cowden regarding EKG- no answer. Will call again in a few minutes.

## 2019-04-04 NOTE — H&P (Signed)
[name] [name] is an 63 y.o. male.    Chief Complaint: vision loss OS  HPI: RD diagnosed OS, h/o RD OD as well s/p repair  Past Medical History:  Diagnosis Date  . Allergy   . Arthritis   . Cataract   . Diverticular disease   . Diverticulosis of colon (without mention of hemorrhage)   . Esophageal reflux   . Family history of malignant neoplasm of gastrointestinal tract   . Hyperlipemia   . Hypertension   . Hypothyroidism   . IBS (irritable bowel syndrome)   . Neuromuscular disorder (HCC)    cipro toxicity  . Obesity   . Personal history of colonic polyps 06/29/2010   hyperplastic  . Sleep apnea    uses CPAP  . Thyroid disease     Past Surgical History:  Procedure Laterality Date  . CATARACT EXTRACTION Bilateral   . CHOLECYSTECTOMY  2000  . HEMORROIDECTOMY    . NASAL SINUS SURGERY  2005  . REPLACEMENT TOTAL KNEE Bilateral   . SIGMOID RESECTION / RECTOPEXY      Family History  Problem Relation Age of Onset  . Colon cancer Father 42  . Allergies Mother   . Allergies Sister   . Allergies Daughter   . Esophageal cancer Neg Hx   . Pancreatic cancer Neg Hx   . Stomach cancer Neg Hx   . Liver disease Neg Hx    Social History:  reports that he has never smoked. He has never used smokeless tobacco. He reports current alcohol use of about 4.0 standard drinks of alcohol per week. He reports that he does not use drugs.  Allergies:  Allergies  Allergen Reactions  . Ciprofloxacin     Neuropathy, tendon damage  . Doxycycline     REACTION: hives  . Statins     Medications Prior to Admission  Medication Sig Dispense Refill  . aspirin 81 MG tablet Take 81 mg by mouth daily.    . calcium carbonate (TUMS - DOSED IN MG ELEMENTAL CALCIUM) 500 MG chewable tablet Chew 1 tablet by mouth as needed for indigestion or heartburn.    . Cholecalciferol (VITAMIN D3) 5000 units TABS Take 1 tablet by mouth daily.    . Coenzyme Q10 (CO Q 10) 10 MG CAPS Take 1 capsule by mouth daily.     Marland Kitchen diltiazem (TIAZAC) 180 MG 24 hr capsule Take 180 mg by mouth daily.    Marland Kitchen levothyroxine (SYNTHROID, LEVOTHROID) 125 MCG tablet Take 125 mcg by mouth daily before breakfast.    . magnesium gluconate (MAGONATE) 500 MG tablet Take 500 mg by mouth daily.    . tamsulosin (FLOMAX) 0.4 MG CAPS capsule Take 0.4 mg by mouth daily.    Marland Kitchen AMPICILLIN PO Take 2 g by mouth.      Results for orders placed or performed during the hospital encounter of 04/04/19 (from the past 48 hour(s))  Basic metabolic panel     Status: None   Collection Time: 04/04/19  1:10 PM  Result Value Ref Range   Sodium 139 135 - 145 mmol/L   Potassium 4.0 3.5 - 5.1 mmol/L   Chloride 103 98 - 111 mmol/L   CO2 24 22 - 32 mmol/L   Glucose, Bld 96 70 - 99 mg/dL   BUN 13 8 - 23 mg/dL   Creatinine, Ser 0.94 0.61 - 1.24 mg/dL   Calcium 9.3 8.9 - 10.3 mg/dL   GFR calc non Af Amer >60 >60 mL/min  GFR calc Af Amer >60 >60 mL/min   Anion gap 12 5 - 15    Comment: Performed at [facility] [address] [place] [address]  CBC     Status: None   Collection Time: 04/04/19  1:10 PM  Result Value Ref Range   WBC 8.1 4.0 - 10.5 K/uL   RBC 4.96 4.22 - 5.81 MIL/uL   Hemoglobin 15.8 13.0 - 17.0 g/dL   HCT 46.0 39.0 - 52.0 %   MCV 92.7 80.0 - 100.0 fL   MCH 31.9 26.0 - 34.0 pg   MCHC 34.3 30.0 - 36.0 g/dL   RDW 13.3 11.5 - 15.5 %   Platelets 151 150 - 400 K/uL   nRBC 0.0 0.0 - 0.2 %    Comment: Performed at [facility], [place] [address], [place], [place] [address]   No results found.  Review of Systems  Eyes: Positive for blurred vision.  All other systems reviewed and are negative.   Blood pressure (!) 155/80, pulse 84, temperature 98.4 F (36.9 C), temperature source Oral, resp. rate 18, height 5\' 10"  (1.778 m), weight (!) 140.6 kg, SpO2 98 %. Physical Exam  Nursing note and vitals reviewed. Constitutional: He appears well-developed and well-nourished.  Eyes:       Assessment/Plan 1. RD OS:  PPV/ED/EL/GFX OS  Corliss Parish, MD 04/04/2019, 5:22 PM

## 2019-04-04 NOTE — Anesthesia Preprocedure Evaluation (Addendum)
Anesthesia Evaluation  Patient identified by MRN, date of birth, ID band Patient awake    Reviewed: Allergy & Precautions, NPO status , Patient's Chart, lab work & pertinent test results  History of Anesthesia Complications Negative for: history of anesthetic complications  Airway Mallampati: IV  TM Distance: >3 FB Neck ROM: Full    Dental  (+) Dental Advisory Given, Teeth Intact   Pulmonary sleep apnea and Continuous Positive Airway Pressure Ventilation ,    breath sounds clear to auscultation       Cardiovascular hypertension, Pt. on medications (-) angina Rhythm:Regular Rate:Normal   '17 TTE - mild concentric hypertrophy. EF 55% to 60%. Grade 1 diastolic dysfunction. Mild AI. The aortic root was mildly dilated. LA was mildly dilated. RV cavity size was moderately dilated. RA was mildly dilated.    Neuro/Psych negative neurological ROS  negative psych ROS   GI/Hepatic Neg liver ROS, GERD  Controlled, IBS    Endo/Other  Hypothyroidism Morbid obesity  Renal/GU negative Renal ROS     Musculoskeletal  (+) Arthritis ,   Abdominal   Peds  Hematology negative hematology ROS (+)   Anesthesia Other Findings   Reproductive/Obstetrics                            Anesthesia Physical Anesthesia Plan  ASA: III  Anesthesia Plan: General   Post-op Pain Management:    Induction: Intravenous  PONV Risk Score and Plan: 2 and Treatment may vary due to age or medical condition, Ondansetron, Midazolam and Dexamethasone  Airway Management Planned: Oral ETT and Video Laryngoscope Planned  Additional Equipment: None  Intra-op Plan:   Post-operative Plan: Extubation in OR  Informed Consent: I have reviewed the patients History and Physical, chart, labs and discussed the procedure including the risks, benefits and alternatives for the proposed anesthesia with the patient or authorized  representative who has indicated his/her understanding and acceptance.     Dental advisory given  Plan Discussed with: CRNA and Anesthesiologist  Anesthesia Plan Comments:        Anesthesia Quick Evaluation

## 2019-04-04 NOTE — Transfer of Care (Signed)
Immediate Anesthesia Transfer of Care Note  Patient: [name] [name]  Procedure(s) Performed: PARS PLANA VITRECTOMY WITH 25 GAUGE (Left Eye) PHOTOCOAGULATION WITH LASER (Left Eye) GAS/FLUID EXCHANGE (Left Eye)  Patient Location: PACU  Anesthesia Type:General  Level of Consciousness: awake, alert , oriented and patient cooperative  Airway & Oxygen Therapy: Patient Spontanous Breathing and Patient connected to nasal cannula oxygen  Post-op Assessment: Report given to RN and Post -op Vital signs reviewed and stable  Post vital signs: Reviewed and stable  Last Vitals:  Vitals Value Taken Time  BP 124/70 04/04/2019  6:48 PM  Temp    Pulse 74 04/04/2019  6:48 PM  Resp 20 04/04/2019  6:48 PM  SpO2 94 % 04/04/2019  6:48 PM  Vitals shown include unvalidated device data.  Last Pain:  Vitals:   04/04/19 1329  TempSrc:   PainSc: 0-No pain      Patients Stated Pain Goal: 3 (16/10/96 0454)  Complications: No apparent anesthesia complications

## 2019-04-04 NOTE — Brief Op Note (Signed)
04/04/2019  6:42 PM  PATIENT:  [name] [name]  63 y.o. male  PRE-OPERATIVE DIAGNOSIS:  RETINAL DETACHMENT, LEFT EYE  POST-OPERATIVE DIAGNOSIS:  RETINAL DETACHMENT, LEFT EYE  PROCEDURE:  Procedure(s): PARS PLANA VITRECTOMY WITH 25 GAUGE (Left) PHOTOCOAGULATION WITH LASER (Left) GAS/FLUID EXCHANGE (Left)  SURGEON:  Surgeon(s) and Role:    [name], MD - Primary  PHYSICIAN ASSISTANT:   ASSISTANTS: none   ANESTHESIA:   general  EBL:  0 mL   BLOOD ADMINISTERED:none  DRAINS: none   LOCAL MEDICATIONS USED:  BUPIVICAINE   SPECIMEN:  No Specimen  DISPOSITION OF SPECIMEN:  N/A  COUNTS:  YES  TOURNIQUET:  * No tourniquets in log *  DICTATION: .Other Dictation: Dictation Number [id number]  PLAN OF CARE: Discharge to home after PACU  PATIENT DISPOSITION:  PACU - hemodynamically stable.   Delay start of Pharmacological VTE agent (>24hrs) due to surgical blood loss or risk of bleeding: n/a

## 2019-04-04 NOTE — Anesthesia Procedure Notes (Signed)
Procedure Name: Intubation Date/Time: 04/04/2019 5:41 PM Performed by: Inda Coke, CRNA Pre-anesthesia Checklist: Patient identified, Emergency Drugs available, Suction available and Patient being monitored Patient Re-evaluated:Patient Re-evaluated prior to induction Oxygen Delivery Method: Circle System Utilized Preoxygenation: Pre-oxygenation with 100% oxygen Induction Type: IV induction, Rapid sequence and Cricoid Pressure applied Laryngoscope Size: 4 and Glidescope Grade View: Grade I Tube type: Oral Tube size: 7.5 mm Number of attempts: 1 Airway Equipment and Method: Stylet and Oral airway Placement Confirmation: ETT inserted through vocal cords under direct vision,  positive ETCO2 and breath sounds checked- equal and bilateral Secured at: 23 cm Tube secured with: Tape Dental Injury: Teeth and Oropharynx as per pre-operative assessment  Difficulty Due To: Difficulty was anticipated and Difficult Airway- due to large tongue Future Recommendations: Recommend- induction with short-acting agent, and alternative techniques readily available

## 2019-04-05 ENCOUNTER — Encounter (HOSPITAL_COMMUNITY): Payer: Self-pay | Admitting: Ophthalmology

## 2019-04-05 DIAGNOSIS — H33012 Retinal detachment with single break, left eye: Secondary | ICD-10-CM | POA: Diagnosis not present

## 2019-04-05 NOTE — Op Note (Signed)
NAME: [name] [name], HEADY MEDICAL RECORD [id number] ACCOUNT [id number] DATE OF BIRTH:[birth year] FACILITY: [facility] LOCATION: [facility] PHYSICIAN:Mackie Goon Greg Cutter, MD  OPERATIVE REPORT  DATE OF PROCEDURE:  04/04/2019  SURGEON:  [name], MD  ANESTHESIA:  General with endotracheal intubation.  PREOPERATIVE DIAGNOSIS:  Retinal detachment, left eye.  POSTOPERATIVE DIAGNOSIS:  Retinal detachment, left eye.  PROCEDURE:  A 25-gauge pars plana vitrectomy with endocautery, endo draining, endolaser, air fluid exchange and gas fluid exchange using 14% C3F8 gas.  FINDINGS:  There was a complex retinal detachment that was created in 3 clock hours of detached retina present.  COMPLICATIONS:  None.  DESCRIPTION OF PROCEDURE:  The patient was identified in the preoperative holding area and taken to the operating room where he was placed under general anesthesia.  At that point in time, the left eye was anesthetized using a retrobulbar block  consisting of a 1:1 mixture of 0.75% bupivacaine and 1% lidocaine with 150 units Hylenex.  After excellent akinesia and anesthesia were obtained, the left eye was prepped and draped in the usual sterile fashion for ocular surgery.  At that point in time,  25-gauge trocars were used to introduce transconjunctival cannulas in the inferior temporal, superior temporal, and superior nasal quadrants.  The trocars were removed, leaving the cannulas in place.  An infusion line was placed in the inferior temporal  cannula and confirmed to be in the vitreous cavity prior to its use.  The eye then underwent a core vitrectomy.  The vitreous cutter was also used to remove a cloudy posterior capsule to improve visualization.  The vitreous was then trimmed out to the  periphery.  Retinal breaks were identified at approximately 1 o'clock in a bed of lattice degeneration.  This retinal break was marked with endocautery.  The lattice extended from approximately 11 o'clock to 1  o'clock with a small break at 11 o'clock and  the larger break at 1 o'clock.  All these breaks were marked with endocautery.  Next, a soft-tipped extrusion cannula was used to enter the retinal break at the 1 o'clock location, and an air fluid exchange was performed.  The majority of the subretinal  fluid was removed this way, but not all of it could be obtained.  Therefore, endocautery probe was used to create an access retinotomy in the superior posterior pole.  An endocautery probe was used for this and then removed.  Next, a soft-tipped  extrusion cannula was used to extrude the remaining posterior subretinal fluid.  Next, endolaser was placed around the retinotomy and all the retinal breaks.  The eye was inspected with scleral depression.  There were no additional breaks or detachments  seen.  At this point, the retina looked very good.  The eye was flushed with a 14% concentration of C3F8 gas.  Once this was completed, the cannulas were removed from the sclerotomies.  The sclerotomies were inspected and found to be airtight.  The eye  was treated with subconjunctival injections of 15 mg of Ancef to 1 mg of dexamethasone.  The eye was treated topically with 1 drop of 1% atropine and TobraDex ointment.  The speculum was removed.  Eyelids were cleaned and closed with a patch and shield.   The patient was then taken to recovery in excellent condition, having tolerated the procedure well.  LN/NUANCE  D:04/04/2019 T:04/05/2019 JOB:[id number]

## 2019-04-06 NOTE — Anesthesia Postprocedure Evaluation (Signed)
Anesthesia Post Note  Patient: [name] [name]  Procedure(s) Performed: PARS PLANA VITRECTOMY WITH 25 GAUGE (Left Eye) PHOTOCOAGULATION WITH LASER (Left Eye) GAS/FLUID EXCHANGE (Left Eye)     Patient location during evaluation: PACU Anesthesia Type: General Level of consciousness: awake and alert Pain management: pain level controlled Vital Signs Assessment: post-procedure vital signs reviewed and stable Respiratory status: spontaneous breathing, nonlabored ventilation, respiratory function stable and patient connected to nasal cannula oxygen Cardiovascular status: blood pressure returned to baseline and stable Postop Assessment: no apparent nausea or vomiting Anesthetic complications: no    Last Vitals:  Vitals:   04/04/19 1905 04/04/19 1918  BP: 119/67   Pulse: 73   Resp: 16   Temp:  36.5 C  SpO2: 95%     Last Pain:  Vitals:   04/04/19 1918  TempSrc:   PainSc: 0-No pain                 Jaycee Pelzer,Imad S

## 2019-04-24 DIAGNOSIS — N401 Enlarged prostate with lower urinary tract symptoms: Secondary | ICD-10-CM | POA: Diagnosis not present

## 2019-04-24 DIAGNOSIS — R3912 Poor urinary stream: Secondary | ICD-10-CM | POA: Diagnosis not present

## 2019-05-21 DIAGNOSIS — E039 Hypothyroidism, unspecified: Secondary | ICD-10-CM | POA: Diagnosis not present

## 2019-05-21 DIAGNOSIS — M170 Bilateral primary osteoarthritis of knee: Secondary | ICD-10-CM | POA: Diagnosis not present

## 2019-05-21 DIAGNOSIS — I10 Essential (primary) hypertension: Secondary | ICD-10-CM | POA: Diagnosis not present

## 2019-05-21 DIAGNOSIS — Z889 Allergy status to unspecified drugs, medicaments and biological substances status: Secondary | ICD-10-CM | POA: Diagnosis not present

## 2019-05-21 DIAGNOSIS — Z0000 Encounter for general adult medical examination without abnormal findings: Secondary | ICD-10-CM | POA: Diagnosis not present

## 2019-05-21 DIAGNOSIS — E7800 Pure hypercholesterolemia, unspecified: Secondary | ICD-10-CM | POA: Diagnosis not present

## 2019-06-13 DIAGNOSIS — H33012 Retinal detachment with single break, left eye: Secondary | ICD-10-CM | POA: Diagnosis not present

## 2019-06-26 DIAGNOSIS — R5381 Other malaise: Secondary | ICD-10-CM | POA: Diagnosis not present

## 2019-06-26 DIAGNOSIS — R5383 Other fatigue: Secondary | ICD-10-CM | POA: Diagnosis not present

## 2019-06-26 DIAGNOSIS — Z20828 Contact with and (suspected) exposure to other viral communicable diseases: Secondary | ICD-10-CM | POA: Diagnosis not present

## 2019-06-26 DIAGNOSIS — R05 Cough: Secondary | ICD-10-CM | POA: Diagnosis not present

## 2019-06-27 DIAGNOSIS — R6889 Other general symptoms and signs: Secondary | ICD-10-CM | POA: Diagnosis not present

## 2019-06-29 DIAGNOSIS — H5213 Myopia, bilateral: Secondary | ICD-10-CM | POA: Diagnosis not present

## 2019-08-27 DIAGNOSIS — R69 Illness, unspecified: Secondary | ICD-10-CM | POA: Diagnosis not present

## 2019-09-01 DIAGNOSIS — R69 Illness, unspecified: Secondary | ICD-10-CM | POA: Diagnosis not present

## 2019-09-02 DIAGNOSIS — R69 Illness, unspecified: Secondary | ICD-10-CM | POA: Diagnosis not present

## 2019-10-10 DIAGNOSIS — G4733 Obstructive sleep apnea (adult) (pediatric): Secondary | ICD-10-CM | POA: Diagnosis not present

## 2019-10-10 DIAGNOSIS — J343 Hypertrophy of nasal turbinates: Secondary | ICD-10-CM | POA: Diagnosis not present

## 2019-11-26 DIAGNOSIS — N400 Enlarged prostate without lower urinary tract symptoms: Secondary | ICD-10-CM | POA: Diagnosis not present

## 2019-11-26 DIAGNOSIS — I4891 Unspecified atrial fibrillation: Secondary | ICD-10-CM | POA: Diagnosis not present

## 2019-11-26 DIAGNOSIS — I10 Essential (primary) hypertension: Secondary | ICD-10-CM | POA: Diagnosis not present

## 2019-11-26 DIAGNOSIS — Z7982 Long term (current) use of aspirin: Secondary | ICD-10-CM | POA: Diagnosis not present

## 2019-11-26 DIAGNOSIS — E039 Hypothyroidism, unspecified: Secondary | ICD-10-CM | POA: Diagnosis not present

## 2019-12-05 DIAGNOSIS — E7800 Pure hypercholesterolemia, unspecified: Secondary | ICD-10-CM | POA: Diagnosis not present

## 2019-12-05 DIAGNOSIS — M109 Gout, unspecified: Secondary | ICD-10-CM | POA: Diagnosis not present

## 2019-12-05 DIAGNOSIS — Z789 Other specified health status: Secondary | ICD-10-CM | POA: Diagnosis not present

## 2019-12-05 DIAGNOSIS — Z1159 Encounter for screening for other viral diseases: Secondary | ICD-10-CM | POA: Diagnosis not present

## 2019-12-05 DIAGNOSIS — Z0000 Encounter for general adult medical examination without abnormal findings: Secondary | ICD-10-CM | POA: Diagnosis not present

## 2019-12-05 DIAGNOSIS — M170 Bilateral primary osteoarthritis of knee: Secondary | ICD-10-CM | POA: Diagnosis not present

## 2019-12-05 DIAGNOSIS — I10 Essential (primary) hypertension: Secondary | ICD-10-CM | POA: Diagnosis not present

## 2019-12-05 DIAGNOSIS — G4733 Obstructive sleep apnea (adult) (pediatric): Secondary | ICD-10-CM | POA: Diagnosis not present

## 2019-12-05 DIAGNOSIS — E039 Hypothyroidism, unspecified: Secondary | ICD-10-CM | POA: Diagnosis not present

## 2019-12-05 DIAGNOSIS — Z125 Encounter for screening for malignant neoplasm of prostate: Secondary | ICD-10-CM | POA: Diagnosis not present

## 2019-12-25 DIAGNOSIS — Z96651 Presence of right artificial knee joint: Secondary | ICD-10-CM | POA: Diagnosis not present

## 2019-12-25 DIAGNOSIS — M25561 Pain in right knee: Secondary | ICD-10-CM | POA: Diagnosis not present

## 2019-12-25 DIAGNOSIS — Z6841 Body Mass Index (BMI) 40.0 and over, adult: Secondary | ICD-10-CM | POA: Diagnosis not present

## 2019-12-25 DIAGNOSIS — Z96652 Presence of left artificial knee joint: Secondary | ICD-10-CM | POA: Diagnosis not present

## 2019-12-25 DIAGNOSIS — T368X5A Adverse effect of other systemic antibiotics, initial encounter: Secondary | ICD-10-CM | POA: Diagnosis not present

## 2020-01-03 ENCOUNTER — Ambulatory Visit: Payer: BLUE CROSS/BLUE SHIELD

## 2020-01-11 ENCOUNTER — Ambulatory Visit: Payer: Medicare HMO | Attending: Internal Medicine

## 2020-01-11 DIAGNOSIS — Z23 Encounter for immunization: Secondary | ICD-10-CM | POA: Insufficient documentation

## 2020-01-11 NOTE — Progress Notes (Signed)
   Covid-19 Vaccination Clinic  Name:  [name] [name]    MRN: [id number] DOB: 1956/01/27  01/11/2020  [name] [name] was observed post Covid-19 immunization for 15 minutes without incidence. He was provided with Vaccine Information Sheet and instruction to access the V-Safe system.   [name] [name] was instructed to call 911 with any severe reactions post vaccine: Marland Kitchen Difficulty breathing  . Swelling of your face and throat  . A fast heartbeat  . A bad rash all over your body  . Dizziness and weakness    Immunizations Administered    Name Date Dose VIS Date Route   Pfizer COVID-19 Vaccine 01/11/2020 10:10 AM 0.3 mL 11/16/2019 Intramuscular   Manufacturer: [place]   Lot: [id number]   [place]: [id number]

## 2020-01-24 ENCOUNTER — Ambulatory Visit: Payer: BLUE CROSS/BLUE SHIELD

## 2020-01-30 DIAGNOSIS — R3912 Poor urinary stream: Secondary | ICD-10-CM | POA: Diagnosis not present

## 2020-01-30 DIAGNOSIS — N401 Enlarged prostate with lower urinary tract symptoms: Secondary | ICD-10-CM | POA: Diagnosis not present

## 2020-02-05 ENCOUNTER — Ambulatory Visit: Payer: Medicare HMO | Attending: Internal Medicine

## 2020-02-05 DIAGNOSIS — Z23 Encounter for immunization: Secondary | ICD-10-CM | POA: Insufficient documentation

## 2020-02-05 NOTE — Progress Notes (Signed)
   Covid-19 Vaccination Clinic  Name:  [name] [name]    MRN: [id number] DOB: [birth year]  02/05/2020  Mr. [name] [name] was observed post Covid-19 immunization for 15 minutes without incident. He was provided with Vaccine Information Sheet and instruction to access the V-Safe system.   Mr. [name] [name] was instructed to call 911 with any severe reactions post vaccine: Marland Kitchen Difficulty breathing  . Swelling of face and throat  . A fast heartbeat  . A bad rash all over body  . Dizziness and weakness   Immunizations Administered    Name Date Dose VIS Date Route   Pfizer COVID-19 Vaccine 02/05/2020 10:39 AM 0.3 mL 11/16/2019 Intramuscular   Manufacturer: [place]   Lot: [id number]   [place]: [id number]

## 2020-02-28 DIAGNOSIS — R69 Illness, unspecified: Secondary | ICD-10-CM | POA: Diagnosis not present

## 2020-05-30 DIAGNOSIS — R269 Unspecified abnormalities of gait and mobility: Secondary | ICD-10-CM | POA: Diagnosis not present

## 2020-05-30 DIAGNOSIS — R29898 Other symptoms and signs involving the musculoskeletal system: Secondary | ICD-10-CM | POA: Diagnosis not present

## 2020-06-25 DIAGNOSIS — E039 Hypothyroidism, unspecified: Secondary | ICD-10-CM | POA: Diagnosis not present

## 2020-06-26 DIAGNOSIS — E7800 Pure hypercholesterolemia, unspecified: Secondary | ICD-10-CM | POA: Diagnosis not present

## 2020-06-26 DIAGNOSIS — E039 Hypothyroidism, unspecified: Secondary | ICD-10-CM | POA: Diagnosis not present

## 2020-06-26 DIAGNOSIS — M170 Bilateral primary osteoarthritis of knee: Secondary | ICD-10-CM | POA: Diagnosis not present

## 2020-06-26 DIAGNOSIS — I10 Essential (primary) hypertension: Secondary | ICD-10-CM | POA: Diagnosis not present

## 2020-09-01 DIAGNOSIS — G4733 Obstructive sleep apnea (adult) (pediatric): Secondary | ICD-10-CM | POA: Diagnosis not present

## 2020-09-01 DIAGNOSIS — H547 Unspecified visual loss: Secondary | ICD-10-CM | POA: Diagnosis not present

## 2020-09-01 DIAGNOSIS — D6869 Other thrombophilia: Secondary | ICD-10-CM | POA: Diagnosis not present

## 2020-09-01 DIAGNOSIS — E039 Hypothyroidism, unspecified: Secondary | ICD-10-CM | POA: Diagnosis not present

## 2020-09-01 DIAGNOSIS — I4891 Unspecified atrial fibrillation: Secondary | ICD-10-CM | POA: Diagnosis not present

## 2020-09-01 DIAGNOSIS — Z7982 Long term (current) use of aspirin: Secondary | ICD-10-CM | POA: Diagnosis not present

## 2020-09-01 DIAGNOSIS — I10 Essential (primary) hypertension: Secondary | ICD-10-CM | POA: Diagnosis not present

## 2020-09-01 DIAGNOSIS — G8929 Other chronic pain: Secondary | ICD-10-CM | POA: Diagnosis not present

## 2020-09-01 DIAGNOSIS — G629 Polyneuropathy, unspecified: Secondary | ICD-10-CM | POA: Diagnosis not present

## 2020-09-04 DIAGNOSIS — R69 Illness, unspecified: Secondary | ICD-10-CM | POA: Diagnosis not present

## 2020-09-05 DIAGNOSIS — R69 Illness, unspecified: Secondary | ICD-10-CM | POA: Diagnosis not present

## 2020-09-11 DIAGNOSIS — Z0100 Encounter for examination of eyes and vision without abnormal findings: Secondary | ICD-10-CM | POA: Diagnosis not present

## 2020-09-11 DIAGNOSIS — H5213 Myopia, bilateral: Secondary | ICD-10-CM | POA: Diagnosis not present

## 2020-11-03 DIAGNOSIS — I10 Essential (primary) hypertension: Secondary | ICD-10-CM | POA: Diagnosis not present

## 2020-11-03 DIAGNOSIS — E7800 Pure hypercholesterolemia, unspecified: Secondary | ICD-10-CM | POA: Diagnosis not present

## 2020-11-03 DIAGNOSIS — E039 Hypothyroidism, unspecified: Secondary | ICD-10-CM | POA: Diagnosis not present

## 2020-11-03 DIAGNOSIS — M170 Bilateral primary osteoarthritis of knee: Secondary | ICD-10-CM | POA: Diagnosis not present

## 2020-12-10 DIAGNOSIS — R269 Unspecified abnormalities of gait and mobility: Secondary | ICD-10-CM | POA: Diagnosis not present

## 2020-12-10 DIAGNOSIS — R29898 Other symptoms and signs involving the musculoskeletal system: Secondary | ICD-10-CM | POA: Diagnosis not present

## 2021-01-01 DIAGNOSIS — M170 Bilateral primary osteoarthritis of knee: Secondary | ICD-10-CM | POA: Diagnosis not present

## 2021-01-01 DIAGNOSIS — M109 Gout, unspecified: Secondary | ICD-10-CM | POA: Diagnosis not present

## 2021-01-01 DIAGNOSIS — G720 Drug-induced myopathy: Secondary | ICD-10-CM | POA: Diagnosis not present

## 2021-01-01 DIAGNOSIS — E7800 Pure hypercholesterolemia, unspecified: Secondary | ICD-10-CM | POA: Diagnosis not present

## 2021-01-01 DIAGNOSIS — I10 Essential (primary) hypertension: Secondary | ICD-10-CM | POA: Diagnosis not present

## 2021-01-01 DIAGNOSIS — Z0000 Encounter for general adult medical examination without abnormal findings: Secondary | ICD-10-CM | POA: Diagnosis not present

## 2021-01-01 DIAGNOSIS — E039 Hypothyroidism, unspecified: Secondary | ICD-10-CM | POA: Diagnosis not present

## 2021-01-01 DIAGNOSIS — Z125 Encounter for screening for malignant neoplasm of prostate: Secondary | ICD-10-CM | POA: Diagnosis not present

## 2021-01-01 DIAGNOSIS — Z23 Encounter for immunization: Secondary | ICD-10-CM | POA: Diagnosis not present

## 2021-02-04 DIAGNOSIS — D485 Neoplasm of uncertain behavior of skin: Secondary | ICD-10-CM | POA: Diagnosis not present

## 2021-02-04 DIAGNOSIS — L570 Actinic keratosis: Secondary | ICD-10-CM | POA: Diagnosis not present

## 2021-02-04 DIAGNOSIS — L820 Inflamed seborrheic keratosis: Secondary | ICD-10-CM | POA: Diagnosis not present

## 2021-02-04 DIAGNOSIS — L578 Other skin changes due to chronic exposure to nonionizing radiation: Secondary | ICD-10-CM | POA: Diagnosis not present

## 2021-03-01 DIAGNOSIS — M170 Bilateral primary osteoarthritis of knee: Secondary | ICD-10-CM | POA: Diagnosis not present

## 2021-03-01 DIAGNOSIS — E039 Hypothyroidism, unspecified: Secondary | ICD-10-CM | POA: Diagnosis not present

## 2021-03-01 DIAGNOSIS — E7800 Pure hypercholesterolemia, unspecified: Secondary | ICD-10-CM | POA: Diagnosis not present

## 2021-03-01 DIAGNOSIS — I10 Essential (primary) hypertension: Secondary | ICD-10-CM | POA: Diagnosis not present

## 2021-04-20 DIAGNOSIS — G629 Polyneuropathy, unspecified: Secondary | ICD-10-CM | POA: Diagnosis not present

## 2021-04-20 DIAGNOSIS — G8929 Other chronic pain: Secondary | ICD-10-CM | POA: Diagnosis not present

## 2021-04-20 DIAGNOSIS — Z7982 Long term (current) use of aspirin: Secondary | ICD-10-CM | POA: Diagnosis not present

## 2021-04-20 DIAGNOSIS — Z9181 History of falling: Secondary | ICD-10-CM | POA: Diagnosis not present

## 2021-04-20 DIAGNOSIS — I739 Peripheral vascular disease, unspecified: Secondary | ICD-10-CM | POA: Diagnosis not present

## 2021-04-20 DIAGNOSIS — Z809 Family history of malignant neoplasm, unspecified: Secondary | ICD-10-CM | POA: Diagnosis not present

## 2021-04-20 DIAGNOSIS — E039 Hypothyroidism, unspecified: Secondary | ICD-10-CM | POA: Diagnosis not present

## 2021-04-20 DIAGNOSIS — I10 Essential (primary) hypertension: Secondary | ICD-10-CM | POA: Diagnosis not present

## 2021-05-05 DIAGNOSIS — I10 Essential (primary) hypertension: Secondary | ICD-10-CM | POA: Diagnosis not present

## 2021-05-05 DIAGNOSIS — E039 Hypothyroidism, unspecified: Secondary | ICD-10-CM | POA: Diagnosis not present

## 2021-05-05 DIAGNOSIS — M170 Bilateral primary osteoarthritis of knee: Secondary | ICD-10-CM | POA: Diagnosis not present

## 2021-05-05 DIAGNOSIS — E7800 Pure hypercholesterolemia, unspecified: Secondary | ICD-10-CM | POA: Diagnosis not present

## 2021-05-28 DIAGNOSIS — R3912 Poor urinary stream: Secondary | ICD-10-CM | POA: Diagnosis not present

## 2021-05-28 DIAGNOSIS — N401 Enlarged prostate with lower urinary tract symptoms: Secondary | ICD-10-CM | POA: Diagnosis not present

## 2021-06-15 ENCOUNTER — Encounter: Payer: Self-pay | Admitting: Internal Medicine

## 2021-07-30 DIAGNOSIS — N401 Enlarged prostate with lower urinary tract symptoms: Secondary | ICD-10-CM | POA: Diagnosis not present

## 2021-07-30 DIAGNOSIS — R3912 Poor urinary stream: Secondary | ICD-10-CM | POA: Diagnosis not present

## 2021-08-04 DIAGNOSIS — I10 Essential (primary) hypertension: Secondary | ICD-10-CM | POA: Diagnosis not present

## 2021-08-04 DIAGNOSIS — M170 Bilateral primary osteoarthritis of knee: Secondary | ICD-10-CM | POA: Diagnosis not present

## 2021-08-04 DIAGNOSIS — E039 Hypothyroidism, unspecified: Secondary | ICD-10-CM | POA: Diagnosis not present

## 2021-08-04 DIAGNOSIS — E7800 Pure hypercholesterolemia, unspecified: Secondary | ICD-10-CM | POA: Diagnosis not present

## 2021-09-14 ENCOUNTER — Encounter: Payer: Self-pay | Admitting: Internal Medicine

## 2021-09-14 ENCOUNTER — Ambulatory Visit (AMBULATORY_SURGERY_CENTER): Payer: Medicare HMO | Admitting: *Deleted

## 2021-09-14 VITALS — Ht 70.0 in | Wt 315.0 lb

## 2021-09-14 DIAGNOSIS — Z86010 Personal history of colonic polyps: Secondary | ICD-10-CM

## 2021-09-14 MED ORDER — PLENVU 140 G PO SOLR: 1.0000 | ORAL | 0 refills | Status: DC

## 2021-09-14 NOTE — Progress Notes (Signed)
No egg or soy allergy known to patient  No issues with past sedation with any surgeries or procedures Patient denies ever being told they had issues or difficulty with intubation  No FH of Malignant Hyperthermia No diet pills per patient No home 02 use per patient  No blood thinners per patient  Pt denies issues with constipation  No A fib or A flutter  EMMI video to pt or via [place] 19 guidelines implemented in PV today with Pt and CMA Pt is fully vaccinated  for Covid      Due to the COVID-19 pandemic we are asking patients to follow certain guidelines.   Pt aware of COVID protocols and [facility] guidelines

## 2021-09-25 ENCOUNTER — Other Ambulatory Visit: Payer: Self-pay

## 2021-09-25 ENCOUNTER — Ambulatory Visit (AMBULATORY_SURGERY_CENTER): Payer: Medicare HMO | Admitting: Internal Medicine

## 2021-09-25 ENCOUNTER — Encounter: Payer: Self-pay | Admitting: Internal Medicine

## 2021-09-25 VITALS — BP 139/79 | HR 63 | Temp 97.1°F | Resp 16 | Ht 70.0 in | Wt 315.0 lb

## 2021-09-25 DIAGNOSIS — G4730 Sleep apnea, unspecified: Secondary | ICD-10-CM | POA: Diagnosis not present

## 2021-09-25 DIAGNOSIS — E039 Hypothyroidism, unspecified: Secondary | ICD-10-CM | POA: Diagnosis not present

## 2021-09-25 DIAGNOSIS — Z800 Family history of malignant neoplasm of digestive organs: Secondary | ICD-10-CM

## 2021-09-25 DIAGNOSIS — Z86010 Personal history of colonic polyps: Secondary | ICD-10-CM | POA: Diagnosis not present

## 2021-09-25 DIAGNOSIS — D122 Benign neoplasm of ascending colon: Secondary | ICD-10-CM | POA: Diagnosis not present

## 2021-09-25 DIAGNOSIS — I10 Essential (primary) hypertension: Secondary | ICD-10-CM | POA: Diagnosis not present

## 2021-09-25 DIAGNOSIS — E669 Obesity, unspecified: Secondary | ICD-10-CM | POA: Diagnosis not present

## 2021-09-25 DIAGNOSIS — K5730 Diverticulosis of large intestine without perforation or abscess without bleeding: Secondary | ICD-10-CM

## 2021-09-25 MED ORDER — SODIUM CHLORIDE 0.9 % IV SOLN: 500.0000 mL | Freq: Once | INTRAVENOUS | Status: DC

## 2021-09-25 NOTE — Patient Instructions (Signed)
Resume previous medications.  1 polyp removed and sent to pathology.  Await pathology for final recommendations.  Handouts on findings (polyps, diverticulosis, hemorrhoids) given to patient.      YOU HAD AN ENDOSCOPIC PROCEDURE TODAY AT [facility]:   Refer to the procedure report that was given to you for any specific questions about what was found during the examination.  If the procedure report does not answer your questions, please call your gastroenterologist to clarify.  If you requested that your care partner not be given the details of your procedure findings, then the procedure report has been included in a sealed envelope for you to review at your convenience later.  YOU SHOULD EXPECT: Some feelings of bloating in the abdomen. Passage of more gas than usual.  Walking can help get rid of the air that was put into your GI tract during the procedure and reduce the bloating. If you had a lower endoscopy (such as a colonoscopy or flexible sigmoidoscopy) you may notice spotting of blood in your stool or on the toilet paper. If you underwent a bowel prep for your procedure, you may not have a normal bowel movement for a few days.  Please Note:  You might notice some irritation and congestion in your nose or some drainage.  This is from the oxygen used during your procedure.  There is no need for concern and it should clear up in a day or so.  SYMPTOMS TO REPORT IMMEDIATELY:  Following lower endoscopy (colonoscopy or flexible sigmoidoscopy):  Excessive amounts of blood in the stool  Significant tenderness or worsening of abdominal pains  Swelling of the abdomen that is new, acute  Fever of 100F or higher   For urgent or emergent issues, a gastroenterologist can be reached at any hour by calling [phone]. Do not use MyChart messaging for urgent concerns.    DIET:  We do recommend a small meal at first, but then you may proceed to your regular diet.  Drink plenty of  fluids but you should avoid alcoholic beverages for 24 hours.  ACTIVITY:  You should plan to take it easy for the rest of today and you should NOT DRIVE or use heavy machinery until tomorrow (because of the sedation medicines used during the test).    FOLLOW UP: Our staff will call the number listed on your records 48-72 hours following your procedure to check on you and address any questions or concerns that you may have regarding the information given to you following your procedure. If we do not reach you, we will leave a message.  We will attempt to reach you two times.  During this call, we will ask if you have developed any symptoms of COVID 19. If you develop any symptoms (ie: fever, flu-like symptoms, shortness of breath, cough etc.) before then, please call [phone].  If you test positive for Covid 19 in the 2 weeks post procedure, please call and report this information to [place].    If any biopsies were taken you will be contacted by phone or by letter within the next 1-3 weeks.  Please call us at [phone] if you have not heard about the biopsies in 3 weeks.    SIGNATURES/CONFIDENTIALITY: You and/or your care partner have signed paperwork which will be entered into your electronic medical record.  These signatures attest to the fact that that the information above on your After Visit Summary has been reviewed and is understood.  Full responsibility  of the confidentiality of this discharge information lies with you and/or your care-partner.

## 2021-09-25 NOTE — Progress Notes (Signed)
Called to room to assist during endoscopic procedure.  Patient ID and intended procedure confirmed with present staff. Received instructions for my participation in the procedure from the performing physician.  

## 2021-09-25 NOTE — Op Note (Signed)
[place] Patient Name: [name] [name] Procedure Date: 09/25/2021 2:04 PM MRN: [id number] Endoscopist: Jerene Bears , MD Age: 65 Referring MD:  Date of Birth: July 07, 1956 Gender: Male Account #: [id number] Procedure:                Colonoscopy Indications:              High risk colon cancer surveillance: Personal                            history of multiple adenomas and SSPs, Family                            history of colon cancer in a first-degree relative                            before age 19 years, Last colonoscopy: September                            2019 Medicines:                Monitored Anesthesia Care Procedure:                Pre-Anesthesia Assessment:                           - Prior to the procedure, a History and Physical                            was performed, and patient medications and                            allergies were reviewed. The patient's tolerance of                            previous anesthesia was also reviewed. The risks                            and benefits of the procedure and the sedation                            options and risks were discussed with the patient.                            All questions were answered, and informed consent                            was obtained. Prior Anticoagulants: The patient has                            taken no previous anticoagulant or antiplatelet                            agents. ASA Grade Assessment: III - A patient with  severe systemic disease. After reviewing the risks                            and benefits, the patient was deemed in                            satisfactory condition to undergo the procedure.                           After obtaining informed consent, the colonoscope                            was passed under direct vision. Throughout the                            procedure, the patient's blood pressure, pulse, and                             oxygen saturations were monitored continuously. The                            Olympus CF-HQ190L (Serial# [id number]) Colonoscope was                            introduced through the anus and advanced to the                            cecum, identified by appendiceal orifice and                            ileocecal valve. The colonoscopy was performed                            without difficulty. The patient tolerated the                            procedure well. The quality of the bowel                            preparation was good. The ileocecal valve,                            appendiceal orifice, and rectum were photographed. Scope In: 2:14:18 PM Scope Out: 2:34:15 PM Scope Withdrawal Time: 0 hours 16 minutes 21 seconds  Total Procedure Duration: 0 hours 19 minutes 57 seconds  Findings:                 The digital rectal exam was normal.                           A 9 mm polyp was found in the ascending colon. The                            polyp was sessile. The polyp was removed with a  cold snare. Resection and retrieval were complete.                           Multiple small and large-mouthed diverticula were                            found in the descending colon and ascending colon.                           There was evidence of a prior end-to-end                            colo-colonic anastomosis in the sigmoid colon. This                            was patent and was characterized by healthy                            appearing mucosa.                           Internal hemorrhoids were found during                            retroflexion. The hemorrhoids were small. Complications:            No immediate complications. Estimated Blood Loss:     Estimated blood loss was minimal. Impression:               - One 9 mm polyp in the ascending colon, removed                            with a cold snare. Resected and retrieved.                            - Diverticulosis in the descending colon and in the                            ascending colon.                           - Patent end-to-end colo-colonic anastomosis,                            characterized by healthy appearing mucosa.                           - Internal hemorrhoids. Recommendation:           - Patient has a contact number available for                            emergencies. The signs and symptoms of potential                            delayed complications were discussed with the  patient. Return to normal activities tomorrow.                            Written discharge instructions were provided to the                            patient.                           - Resume previous diet.                           - Continue present medications.                           - Await pathology results.                           - Repeat colonoscopy is recommended for                            surveillance. The colonoscopy date will be                            determined after pathology results from today's                            exam become available for review. Jerene Bears, MD 09/25/2021 2:37:42 PM This report has been signed electronically.

## 2021-09-25 NOTE — Progress Notes (Signed)
Sedate, gd SR, tolerated procedure well, VSS, report to RN 

## 2021-09-25 NOTE — Progress Notes (Signed)
GASTROENTEROLOGY PROCEDURE H&P NOTE   Primary Care Physician: Orpah Melter, MD    Reason for Procedure:  History of adenomatous and sessile serrated polyps, family history of colon cancer  Plan:    Surveillance colonoscopy  Patient is appropriate for endoscopic procedure(s) in the ambulatory ([place]) setting.  The nature of the procedure, as well as the risks, benefits, and alternatives were carefully and thoroughly reviewed with the patient. Ample time for discussion and questions allowed. The patient understood, was satisfied, and agreed to proceed.     HPI: [name] [name] is a 65 y.o. male who presents for surveillance colonoscopy.  Medical history as below.  Last colonoscopy 2019.  Tolerated the prep.  No complaints today including chest pain or shortness of breath.  No abdominal pain.  Patient's father had colon cancer around age 75  Past Medical History:  Diagnosis Date   Allergy    Arthritis    Cataract    Diverticular disease    Diverticulosis of colon (without mention of hemorrhage)    Esophageal reflux    Family history of malignant neoplasm of gastrointestinal tract    Hyperlipemia    Hypertension    Hypothyroidism    IBS (irritable bowel syndrome)    Neuromuscular disorder (HCC)    cipro toxicity   Obesity    Personal history of colonic polyps 06/29/2010   hyperplastic   Sleep apnea    uses CPAP   Thyroid disease     Past Surgical History:  Procedure Laterality Date   CATARACT EXTRACTION Bilateral    CHOLECYSTECTOMY  2000   GAS/FLUID EXCHANGE Left 04/04/2019   Procedure: GAS/FLUID EXCHANGE;  Surgeon: [name], MD;  Location: [facility];  Service: Ophthalmology;  Laterality: Left;   HEMORROIDECTOMY     NASAL SINUS SURGERY  2005   PARS PLANA VITRECTOMY Left 04/04/2019   Procedure: PARS PLANA VITRECTOMY WITH 25 GAUGE;  Surgeon: [name], MD;  Location: [facility];  Service: Ophthalmology;  Laterality: Left;   PHOTOCOAGULATION WITH LASER Left  04/04/2019   Procedure: PHOTOCOAGULATION WITH LASER;  Surgeon: [name], MD;  Location: [facility];  Service: Ophthalmology;  Laterality: Left;   REPLACEMENT TOTAL KNEE Bilateral    SIGMOID RESECTION / RECTOPEXY      Prior to Admission medications   Medication Sig Start Date End Date Taking? Authorizing Provider  aspirin 81 MG tablet Take 81 mg by mouth daily.   Yes [provider]  Cholecalciferol (VITAMIN D3) 5000 units TABS Take 1 tablet by mouth daily.   Yes [provider]  Coenzyme Q10 (CO Q 10) 10 MG CAPS Take 1 capsule by mouth daily. 01/15/10  Yes [provider]  diltiazem (TIAZAC) 180 MG 24 hr capsule Take 180 mg by mouth daily.   Yes [provider]  levothyroxine (SYNTHROID) 150 MCG tablet Take 150 mcg by mouth daily. 07/05/21  Yes [provider]  magnesium gluconate (MAGONATE) 500 MG tablet Take 500 mg by mouth daily.   Yes [provider]  nortriptyline (PAMELOR) 10 MG capsule Take 10 mg by mouth 2 (two) times daily.   Yes [provider]  cephALEXin (KEFLEX) 500 MG capsule Take 500 mg by mouth 2 (two) times daily. Patient not taking: Reported on 09/25/2021 09/22/21   [provider]    Current Outpatient Medications  Medication Sig Dispense Refill   aspirin 81 MG tablet Take 81 mg by mouth daily.     Cholecalciferol (VITAMIN D3) 5000 units TABS Take  1 tablet by mouth daily.     Coenzyme Q10 (CO Q 10) 10 MG CAPS Take 1 capsule by mouth daily.     diltiazem (TIAZAC) 180 MG 24 hr capsule Take 180 mg by mouth daily.     levothyroxine (SYNTHROID) 150 MCG tablet Take 150 mcg by mouth daily.     magnesium gluconate (MAGONATE) 500 MG tablet Take 500 mg by mouth daily.     nortriptyline (PAMELOR) 10 MG capsule Take 10 mg by mouth 2 (two) times daily.     cephALEXin (KEFLEX) 500 MG capsule Take 500 mg by mouth 2 (two) times daily. (Patient not taking: Reported on 09/25/2021)     Current Facility-Administered  Medications  Medication Dose Route Frequency Provider Last Rate Last Admin   0.9 %  sodium chloride infusion  500 mL Intravenous Once Antwaun Buth, Lajuan Lines, MD        Allergies as of 09/25/2021 - Review Complete 09/25/2021  Allergen Reaction Noted   Ciprofloxacin  07/14/2015   Doxycycline  06/24/2010   Statins  03/26/2013    Family History  Problem Relation Age of Onset   Allergies Mother    Colon cancer Father 57   Allergies Sister    Allergies Daughter    Esophageal cancer Neg Hx    Pancreatic cancer Neg Hx    Stomach cancer Neg Hx    Liver disease Neg Hx    Rectal cancer Neg Hx     Social History   Socioeconomic History   Marital status: Married    Spouse name: Not on file   Number of children: 1   Years of education: college   Highest education level: Not on file  Occupational History    Employer: [employer]  Tobacco Use   Smoking status: Never   Smokeless tobacco: Never  Vaping Use   Vaping Use: Never used  Substance and Sexual Activity   Alcohol use: Yes    Alcohol/week: 4.0 standard drinks    Types: 4 Glasses of wine per week    Comment: occ    Drug use: No   Sexual activity: Not on file  Other Topics Concern   Not on file  Social History Narrative   Occ caffeine .   Patient lives at home alone. Patient is widowed. Patient works full time Administrator.   Right handed.         Social Determinants of Health   Financial Resource Strain: Not on file  Food Insecurity: Not on file  Transportation Needs: Not on file  Physical Activity: Not on file  Stress: Not on file  Social Connections: Not on file  Intimate Partner Violence: Not on file    Physical Exam: Vital signs in last 24 hours: @BP  132/68   Pulse 71   Temp (!) 97.1 F (36.2 C) (Temporal)   Ht 5\' 10"  (1.778 m)   Wt (!) 315 lb (142.9 kg)   SpO2 98%   BMI 45.20 kg/m  GEN: NAD EYE: Sclerae anicteric ENT: MMM CV: Non-tachycardic Pulm: CTA b/l GI: Soft, NT/ND NEURO:  Alert  & Oriented x 3   Zenovia Jarred, MD [facility]  09/25/2021 2:06 PM

## 2021-09-25 NOTE — Progress Notes (Signed)
Pt's states no medical or surgical changes since previsit or office visit. 

## 2021-09-28 DIAGNOSIS — R3912 Poor urinary stream: Secondary | ICD-10-CM | POA: Diagnosis not present

## 2021-09-28 DIAGNOSIS — N401 Enlarged prostate with lower urinary tract symptoms: Secondary | ICD-10-CM | POA: Diagnosis not present

## 2021-09-29 ENCOUNTER — Telehealth: Payer: Self-pay

## 2021-09-29 NOTE — Telephone Encounter (Signed)
  Follow up Call-  Call back number 09/25/2021  Post procedure Call Back phone  # [phone]  Permission to leave phone message Yes  Some recent data might be hidden     Patient questions:  Do you have a fever, pain , or abdominal swelling? No. Pain Score  0 *  Have you tolerated food without any problems? Yes.    Have you been able to return to your normal activities? Yes.    Do you have any questions about your discharge instructions: Diet   No. Medications  No. Follow up visit  No.  Do you have questions or concerns about your Care? No.  Actions: * If pain score is 4 or above: No action needed, pain <4.  Have you developed a fever since your procedure? no  2.   Have you had an respiratory symptoms (SOB or cough) since your procedure? no  3.   Have you tested positive for COVID 19 since your procedure no  4.   Have you had any family members/close contacts diagnosed with the COVID 19 since your procedure?  no   If yes to any of these questions please route to Joylene John, RN and Joella Prince, RN

## 2021-09-30 ENCOUNTER — Encounter: Payer: Self-pay | Admitting: Internal Medicine

## 2021-10-22 DIAGNOSIS — R3912 Poor urinary stream: Secondary | ICD-10-CM | POA: Diagnosis not present

## 2021-10-22 DIAGNOSIS — N401 Enlarged prostate with lower urinary tract symptoms: Secondary | ICD-10-CM | POA: Diagnosis not present

## 2021-12-08 DIAGNOSIS — Z96652 Presence of left artificial knee joint: Secondary | ICD-10-CM | POA: Diagnosis not present

## 2021-12-08 DIAGNOSIS — Z96653 Presence of artificial knee joint, bilateral: Secondary | ICD-10-CM | POA: Diagnosis not present

## 2021-12-08 DIAGNOSIS — Z471 Aftercare following joint replacement surgery: Secondary | ICD-10-CM | POA: Diagnosis not present

## 2021-12-08 DIAGNOSIS — Z96651 Presence of right artificial knee joint: Secondary | ICD-10-CM | POA: Diagnosis not present

## 2021-12-15 DIAGNOSIS — D6869 Other thrombophilia: Secondary | ICD-10-CM | POA: Diagnosis not present

## 2021-12-15 DIAGNOSIS — G629 Polyneuropathy, unspecified: Secondary | ICD-10-CM | POA: Diagnosis not present

## 2021-12-15 DIAGNOSIS — E039 Hypothyroidism, unspecified: Secondary | ICD-10-CM | POA: Diagnosis not present

## 2021-12-15 DIAGNOSIS — I4891 Unspecified atrial fibrillation: Secondary | ICD-10-CM | POA: Diagnosis not present

## 2021-12-15 DIAGNOSIS — Z881 Allergy status to other antibiotic agents status: Secondary | ICD-10-CM | POA: Diagnosis not present

## 2021-12-15 DIAGNOSIS — G4733 Obstructive sleep apnea (adult) (pediatric): Secondary | ICD-10-CM | POA: Diagnosis not present

## 2021-12-15 DIAGNOSIS — Z7982 Long term (current) use of aspirin: Secondary | ICD-10-CM | POA: Diagnosis not present

## 2021-12-15 DIAGNOSIS — Z87892 Personal history of anaphylaxis: Secondary | ICD-10-CM | POA: Diagnosis not present

## 2021-12-15 DIAGNOSIS — I10 Essential (primary) hypertension: Secondary | ICD-10-CM | POA: Diagnosis not present

## 2022-02-01 DIAGNOSIS — R3912 Poor urinary stream: Secondary | ICD-10-CM | POA: Diagnosis not present

## 2022-02-01 DIAGNOSIS — R69 Illness, unspecified: Secondary | ICD-10-CM | POA: Diagnosis not present

## 2022-02-01 DIAGNOSIS — N401 Enlarged prostate with lower urinary tract symptoms: Secondary | ICD-10-CM | POA: Diagnosis not present

## 2022-02-15 DIAGNOSIS — E349 Endocrine disorder, unspecified: Secondary | ICD-10-CM | POA: Diagnosis not present

## 2022-03-11 DIAGNOSIS — Z0100 Encounter for examination of eyes and vision without abnormal findings: Secondary | ICD-10-CM | POA: Diagnosis not present

## 2022-03-31 DIAGNOSIS — E349 Endocrine disorder, unspecified: Secondary | ICD-10-CM | POA: Diagnosis not present

## 2022-04-14 DIAGNOSIS — M170 Bilateral primary osteoarthritis of knee: Secondary | ICD-10-CM | POA: Diagnosis not present

## 2022-04-14 DIAGNOSIS — I10 Essential (primary) hypertension: Secondary | ICD-10-CM | POA: Diagnosis not present

## 2022-04-14 DIAGNOSIS — E7800 Pure hypercholesterolemia, unspecified: Secondary | ICD-10-CM | POA: Diagnosis not present

## 2022-04-14 DIAGNOSIS — E039 Hypothyroidism, unspecified: Secondary | ICD-10-CM | POA: Diagnosis not present

## 2022-04-14 DIAGNOSIS — G720 Drug-induced myopathy: Secondary | ICD-10-CM | POA: Diagnosis not present

## 2022-04-14 DIAGNOSIS — Z0000 Encounter for general adult medical examination without abnormal findings: Secondary | ICD-10-CM | POA: Diagnosis not present

## 2022-05-20 DIAGNOSIS — R3912 Poor urinary stream: Secondary | ICD-10-CM | POA: Diagnosis not present

## 2022-05-20 DIAGNOSIS — E349 Endocrine disorder, unspecified: Secondary | ICD-10-CM | POA: Diagnosis not present

## 2022-05-20 DIAGNOSIS — N401 Enlarged prostate with lower urinary tract symptoms: Secondary | ICD-10-CM | POA: Diagnosis not present

## 2022-05-20 DIAGNOSIS — E291 Testicular hypofunction: Secondary | ICD-10-CM | POA: Diagnosis not present

## 2022-05-20 DIAGNOSIS — R69 Illness, unspecified: Secondary | ICD-10-CM | POA: Diagnosis not present

## 2022-06-22 DIAGNOSIS — Z6841 Body Mass Index (BMI) 40.0 and over, adult: Secondary | ICD-10-CM | POA: Diagnosis not present

## 2022-06-22 DIAGNOSIS — E291 Testicular hypofunction: Secondary | ICD-10-CM | POA: Diagnosis not present

## 2022-06-22 DIAGNOSIS — E039 Hypothyroidism, unspecified: Secondary | ICD-10-CM | POA: Diagnosis not present

## 2022-06-22 DIAGNOSIS — I10 Essential (primary) hypertension: Secondary | ICD-10-CM | POA: Diagnosis not present

## 2022-07-01 DIAGNOSIS — Z125 Encounter for screening for malignant neoplasm of prostate: Secondary | ICD-10-CM | POA: Diagnosis not present

## 2022-07-01 DIAGNOSIS — E291 Testicular hypofunction: Secondary | ICD-10-CM | POA: Diagnosis not present

## 2022-07-02 DIAGNOSIS — L821 Other seborrheic keratosis: Secondary | ICD-10-CM | POA: Diagnosis not present

## 2022-07-02 DIAGNOSIS — L570 Actinic keratosis: Secondary | ICD-10-CM | POA: Diagnosis not present

## 2022-07-08 DIAGNOSIS — M6281 Muscle weakness (generalized): Secondary | ICD-10-CM | POA: Diagnosis not present

## 2022-07-08 DIAGNOSIS — Z79899 Other long term (current) drug therapy: Secondary | ICD-10-CM | POA: Diagnosis not present

## 2022-07-08 DIAGNOSIS — Z9049 Acquired absence of other specified parts of digestive tract: Secondary | ICD-10-CM | POA: Diagnosis not present

## 2022-07-08 DIAGNOSIS — H02402 Unspecified ptosis of left eyelid: Secondary | ICD-10-CM | POA: Diagnosis not present

## 2022-07-15 ENCOUNTER — Other Ambulatory Visit: Payer: Self-pay | Admitting: *Deleted

## 2022-07-15 NOTE — Patient Instructions (Signed)
Visit Information  Thank you for taking time to visit with me today. Please don't hesitate to contact me if I can be of assistance to you.   Following are the goals we discussed today:   Goals Addressed               This Visit's Progress     No needs (pt-stated)        Care Coordination Interventions: Reviewed scheduled/upcoming provider appointments including Pt had a medical examine on 04/14/2022 and considered that his AWV and refused assistance via [facility] to confirm or arranged an AWV with primary.  Screening for signs and symptoms of depression related to chronic disease state  Assessed social determinant of health barriers            Please call the care guide team at [phone] if you need to cancel or reschedule your appointment.   If you are experiencing a Mental Health or [place] or need someone to talk to, please call the Suicide and Crisis Lifeline: 988  The patient verbalized understanding of instructions, educational materials, and care plan provided today and DECLINED offer to receive copy of patient instructions, educational materials, and care plan.   No further follow up required: no needs    Raina Mina, RN Care Management Coordinator [place] Office [phone]

## 2022-07-15 NOTE — Patient Outreach (Signed)
  Care Coordination   Initial Visit Note   07/15/2022 Name: [name] [name] MRN: [id number] DOB: November 16, 1956  [name] [name] is a 66 y.o. year old male who sees Orpah Melter, MD for primary care. I spoke with  [name] [name] by phone today  What matters to the patients health and wellness today?  No needs    Goals Addressed               This Visit's Progress     No needs (pt-stated)        Care Coordination Interventions: Reviewed scheduled/upcoming provider appointments including Pt had a medical examine on 04/14/2022 and considered that his AWV and refused assistance via [facility] to confirm or arranged an AWV with primary.  Screening for signs and symptoms of depression related to chronic disease state  Assessed social determinant of health barriers          SDOH assessments and interventions completed:  Yes  SDOH Interventions Today    Flowsheet Row Most Recent Value  SDOH Interventions   Food Insecurity Interventions Intervention Not Indicated  Housing Interventions Intervention Not Indicated  Transportation Interventions Intervention Not Indicated        Care Coordination Interventions Activated:  Yes  Care Coordination Interventions:  Yes, provided   Follow up plan: No further intervention required.   Encounter Outcome:  Pt. Visit Completed   Raina Mina, RN Care Management Coordinator [place] Office [phone]

## 2022-08-06 DIAGNOSIS — H532 Diplopia: Secondary | ICD-10-CM | POA: Diagnosis not present

## 2022-08-06 DIAGNOSIS — H02402 Unspecified ptosis of left eyelid: Secondary | ICD-10-CM | POA: Diagnosis not present

## 2022-09-02 DIAGNOSIS — E291 Testicular hypofunction: Secondary | ICD-10-CM | POA: Diagnosis not present

## 2023-01-11 DIAGNOSIS — M6281 Muscle weakness (generalized): Secondary | ICD-10-CM | POA: Diagnosis not present

## 2023-02-21 DIAGNOSIS — E291 Testicular hypofunction: Secondary | ICD-10-CM | POA: Diagnosis not present

## 2023-02-28 DIAGNOSIS — D751 Secondary polycythemia: Secondary | ICD-10-CM | POA: Diagnosis not present

## 2023-02-28 DIAGNOSIS — E291 Testicular hypofunction: Secondary | ICD-10-CM | POA: Diagnosis not present

## 2023-03-15 DIAGNOSIS — Z0100 Encounter for examination of eyes and vision without abnormal findings: Secondary | ICD-10-CM | POA: Diagnosis not present

## 2023-04-12 DIAGNOSIS — E291 Testicular hypofunction: Secondary | ICD-10-CM | POA: Diagnosis not present

## 2023-04-12 DIAGNOSIS — D751 Secondary polycythemia: Secondary | ICD-10-CM | POA: Diagnosis not present

## 2023-04-20 DIAGNOSIS — I10 Essential (primary) hypertension: Secondary | ICD-10-CM | POA: Diagnosis not present

## 2023-04-20 DIAGNOSIS — G720 Drug-induced myopathy: Secondary | ICD-10-CM | POA: Diagnosis not present

## 2023-04-20 DIAGNOSIS — E039 Hypothyroidism, unspecified: Secondary | ICD-10-CM | POA: Diagnosis not present

## 2023-04-20 DIAGNOSIS — Z0000 Encounter for general adult medical examination without abnormal findings: Secondary | ICD-10-CM | POA: Diagnosis not present

## 2023-04-20 DIAGNOSIS — E7800 Pure hypercholesterolemia, unspecified: Secondary | ICD-10-CM | POA: Diagnosis not present

## 2023-04-20 DIAGNOSIS — Z6841 Body Mass Index (BMI) 40.0 and over, adult: Secondary | ICD-10-CM | POA: Diagnosis not present

## 2023-04-20 DIAGNOSIS — Z9181 History of falling: Secondary | ICD-10-CM | POA: Diagnosis not present

## 2023-05-19 DIAGNOSIS — E291 Testicular hypofunction: Secondary | ICD-10-CM | POA: Diagnosis not present

## 2023-05-19 DIAGNOSIS — R948 Abnormal results of function studies of other organs and systems: Secondary | ICD-10-CM | POA: Diagnosis not present

## 2023-05-26 DIAGNOSIS — E291 Testicular hypofunction: Secondary | ICD-10-CM | POA: Diagnosis not present

## 2023-05-26 DIAGNOSIS — R3912 Poor urinary stream: Secondary | ICD-10-CM | POA: Diagnosis not present

## 2023-05-26 DIAGNOSIS — N401 Enlarged prostate with lower urinary tract symptoms: Secondary | ICD-10-CM | POA: Diagnosis not present

## 2023-06-29 DIAGNOSIS — E291 Testicular hypofunction: Secondary | ICD-10-CM | POA: Diagnosis not present

## 2023-07-12 DIAGNOSIS — M6281 Muscle weakness (generalized): Secondary | ICD-10-CM | POA: Diagnosis not present

## 2023-07-12 DIAGNOSIS — Z8639 Personal history of other endocrine, nutritional and metabolic disease: Secondary | ICD-10-CM | POA: Diagnosis not present

## 2023-07-12 DIAGNOSIS — H02402 Unspecified ptosis of left eyelid: Secondary | ICD-10-CM | POA: Diagnosis not present

## 2023-07-12 DIAGNOSIS — Z79899 Other long term (current) drug therapy: Secondary | ICD-10-CM | POA: Diagnosis not present

## 2023-07-26 DIAGNOSIS — Z6841 Body Mass Index (BMI) 40.0 and over, adult: Secondary | ICD-10-CM | POA: Diagnosis not present

## 2023-07-26 DIAGNOSIS — M159 Polyosteoarthritis, unspecified: Secondary | ICD-10-CM | POA: Diagnosis not present

## 2023-07-26 DIAGNOSIS — I10 Essential (primary) hypertension: Secondary | ICD-10-CM | POA: Diagnosis not present

## 2023-07-26 DIAGNOSIS — G4733 Obstructive sleep apnea (adult) (pediatric): Secondary | ICD-10-CM | POA: Diagnosis not present

## 2023-07-26 DIAGNOSIS — E785 Hyperlipidemia, unspecified: Secondary | ICD-10-CM | POA: Diagnosis not present

## 2023-09-28 DIAGNOSIS — J329 Chronic sinusitis, unspecified: Secondary | ICD-10-CM | POA: Diagnosis not present

## 2023-10-04 DIAGNOSIS — E66813 Obesity, class 3: Secondary | ICD-10-CM | POA: Diagnosis not present

## 2023-10-04 DIAGNOSIS — E785 Hyperlipidemia, unspecified: Secondary | ICD-10-CM | POA: Diagnosis not present

## 2023-10-04 DIAGNOSIS — Z6841 Body Mass Index (BMI) 40.0 and over, adult: Secondary | ICD-10-CM | POA: Diagnosis not present

## 2023-10-04 DIAGNOSIS — I10 Essential (primary) hypertension: Secondary | ICD-10-CM | POA: Diagnosis not present

## 2023-10-04 DIAGNOSIS — M150 Primary generalized (osteo)arthritis: Secondary | ICD-10-CM | POA: Diagnosis not present

## 2023-10-04 DIAGNOSIS — G4733 Obstructive sleep apnea (adult) (pediatric): Secondary | ICD-10-CM | POA: Diagnosis not present

## 2023-10-25 DIAGNOSIS — J320 Chronic maxillary sinusitis: Secondary | ICD-10-CM | POA: Diagnosis not present

## 2023-10-25 DIAGNOSIS — H6122 Impacted cerumen, left ear: Secondary | ICD-10-CM | POA: Diagnosis not present

## 2024-04-24 ENCOUNTER — Telehealth: Payer: Self-pay | Admitting: Pharmacy Technician

## 2024-04-24 ENCOUNTER — Ambulatory Visit: Attending: Cardiovascular Disease | Admitting: Cardiovascular Disease

## 2024-04-24 ENCOUNTER — Other Ambulatory Visit (HOSPITAL_COMMUNITY): Payer: Self-pay

## 2024-04-24 ENCOUNTER — Telehealth: Payer: Self-pay | Admitting: Pharmacist Clinician (PhC)/ Clinical Pharmacy Specialist

## 2024-04-24 ENCOUNTER — Encounter: Payer: Self-pay | Admitting: Cardiovascular Disease

## 2024-04-24 VITALS — BP 137/89 | HR 73 | Ht 70.0 in | Wt 298.0 lb

## 2024-04-24 DIAGNOSIS — I4510 Unspecified right bundle-branch block: Secondary | ICD-10-CM

## 2024-04-24 DIAGNOSIS — E785 Hyperlipidemia, unspecified: Secondary | ICD-10-CM | POA: Diagnosis not present

## 2024-04-24 DIAGNOSIS — G4733 Obstructive sleep apnea (adult) (pediatric): Secondary | ICD-10-CM

## 2024-04-24 DIAGNOSIS — I10 Essential (primary) hypertension: Secondary | ICD-10-CM | POA: Diagnosis not present

## 2024-04-24 DIAGNOSIS — I4819 Other persistent atrial fibrillation: Secondary | ICD-10-CM

## 2024-04-24 DIAGNOSIS — I4891 Unspecified atrial fibrillation: Secondary | ICD-10-CM | POA: Insufficient documentation

## 2024-04-24 DIAGNOSIS — R0600 Dyspnea, unspecified: Secondary | ICD-10-CM

## 2024-04-24 DIAGNOSIS — Z6841 Body Mass Index (BMI) 40.0 and over, adult: Secondary | ICD-10-CM

## 2024-04-24 DIAGNOSIS — E66813 Obesity, class 3: Secondary | ICD-10-CM

## 2024-04-24 MED ORDER — APIXABAN 5 MG PO TABS: 5.0000 mg | ORAL_TABLET | Freq: Two times a day (BID) | ORAL | 1 refills | Status: DC

## 2024-04-24 NOTE — Assessment & Plan Note (Signed)
 Chronic

## 2024-04-24 NOTE — Telephone Encounter (Signed)
 Pharmacy Patient Advocate Encounter  Received notification from [facility] MEDICARE that Prior Authorization for REPATHA has been APPROVED from 12/07/23 to 12/05/24. Ran test claim, Copay is $144.24. This test claim was processed through [facility]- copay amounts may vary at other pharmacies due to pharmacy/plan contracts, or as the patient moves through the different stages of their insurance plan.

## 2024-04-24 NOTE — Telephone Encounter (Signed)
 Please do PA for Repatha.  Patient had myalgias with atorvastatin, rosuvastatin and simvastatin in the past

## 2024-04-24 NOTE — Assessment & Plan Note (Signed)
 History of dyslipidemia intolerant to statin therapy with lipid profile performed by his PCP 07/28/2023 revealing total cholesterol 234, LDL 173 and HDL 40.  He would be a candidate for PCSK9.

## 2024-04-24 NOTE — Assessment & Plan Note (Signed)
 History of essential hypertension with blood pressure measured today 137/89.  He is on diltiazem.

## 2024-04-24 NOTE — Telephone Encounter (Signed)
 Pharmacy Patient Advocate Encounter   Received notification from Pt Calls Messages that prior authorization for repatha is required/requested.   Insurance verification completed.   The patient is insured through [employer] .   Per test claim: PA required; PA submitted to above mentioned insurance via CoverMyMeds Key/confirmation #/EOC [id number] Status is pending

## 2024-04-24 NOTE — Assessment & Plan Note (Signed)
 On CPAP since 2007.

## 2024-04-24 NOTE — Assessment & Plan Note (Signed)
BMI 42

## 2024-04-24 NOTE — Assessment & Plan Note (Addendum)
 Increasing shortness of breath recently.  He has seen Dr. Waymond Hailey in the past.  He has not smoked.  I am concerned that this may be cardiovascular in nature.  It may be related to his A-fib.  I am going to get a 2D echo and a coronary calcium score to her stratify.

## 2024-04-24 NOTE — Patient Instructions (Addendum)
 Medication Instructions:  Start Eliquis 5 MG by mouth twice daily.  Start Repatha Sureclick 140mg /mL every 14 days   *If you need a refill on your cardiac medications before your next appointment, please call your pharmacy*   Lab Work: NONE    If you have labs (blood work) drawn today and your tests are completely normal, you will receive your results only by: MyChart Message (if you have MyChart) OR A paper copy in the mail If you have any lab test that is abnormal or we need to change your treatment, we will call you to review the results.   Testing/Procedures: Your physician has requested that you have an echocardiogram. Echocardiography is a painless test that uses sound waves to create images of your heart. It provides your doctor with information about the size and shape of your heart and how well your heart's chambers and valves are working. This procedure takes approximately one hour. There are no restrictions for this procedure. Please do NOT wear cologne, perfume, aftershave, or lotions (deodorant is allowed). Please arrive 15 minutes prior to your appointment time.  Please note: We ask at that you not bring children with you during ultrasound (echo/ vascular) testing. Due to room size and safety concerns, children are not allowed in the ultrasound rooms during exams. Our front office staff cannot provide observation of children in our lobby area while testing is being conducted. An adult accompanying a patient to their appointment will only be allowed in the ultrasound room at the discretion of the ultrasound technician under special circumstances. We apologize for any inconvenience.   Dr. Katheryne Pane  has ordered a CT coronary calcium score.   Test locations:  [facility]  [facility] at [facility]  This is $99 out of pocket.   Coronary CalciumScan A coronary calcium scan is an imaging test used to  look for deposits of calcium and other fatty materials (plaques) in the inner lining of the blood vessels of the heart (coronary arteries). These deposits of calcium and plaques can partly clog and narrow the coronary arteries without producing any symptoms or warning signs. This puts a person at risk for a heart attack. This test can detect these deposits before symptoms develop. Tell a health care provider about: Any allergies you have. All medicines you are taking, including vitamins, herbs, eye drops, creams, and over-the-counter medicines. Any problems you or family members have had with anesthetic medicines. Any blood disorders you have. Any surgeries you have had. Any medical conditions you have. Whether you are pregnant or may be pregnant. What are the risks? Generally, this is a safe procedure. However, problems may occur, including: Harm to a pregnant woman and her unborn baby. This test involves the use of radiation. Radiation exposure can be dangerous to a pregnant woman and her unborn baby. If you are pregnant, you generally should not have this procedure done. Slight increase in the risk of cancer. This is because of the radiation involved in the test. What happens before the procedure? No preparation is needed for this procedure. What happens during the procedure? You will undress and remove any jewelry around your neck or chest. You will put on a hospital gown. Sticky electrodes will be placed on your chest. The electrodes will be connected to an electrocardiogram (ECG) machine to record a tracing of the electrical activity of your heart. A CT scanner will take pictures of your heart. During this time,  you will be asked to lie still and hold your breath for 2-3 seconds while a picture of your heart is being taken. The procedure may vary among health care providers and hospitals. What happens after the procedure? You can get dressed. You can return to your normal activities. It  is up to you to get the results of your test. Ask your health care provider, or the department that is doing the test, when your results will be ready. Summary A coronary calcium scan is an imaging test used to look for deposits of calcium and other fatty materials (plaques) in the inner lining of the blood vessels of the heart (coronary arteries). Generally, this is a safe procedure. Tell your health care provider if you are pregnant or may be pregnant. No preparation is needed for this procedure. A CT scanner will take pictures of your heart. You can return to your normal activities after the scan is done. This information is not intended to replace advice given to you by your health care provider. Make sure you discuss any questions you have with your health care provider. Document Released: 05/20/2008 Document Revised: 10/11/2016 Document Reviewed: 10/11/2016 Elsevier Interactive Patient Education  2017 [employer].      Follow-Up: At [facility], you and your health needs are our priority.  As part of our continuing mission to provide you with exceptional heart care, we have created designated Provider Care Teams.  These Care Teams include your primary Cardiologist (physician) and Advanced Practice Providers (APPs -  Physician Assistants and Nurse Practitioners) who all work together to provide you with the care you need, when you need it.  We recommend signing up for the patient portal called "MyChart".  Sign up information is provided on this After Visit Summary.  MyChart is used to connect with patients for Virtual Visits (Telemedicine).  Patients are able to view lab/test results, encounter notes, upcoming appointments, etc.  Non-urgent messages can be sent to your provider as well.   To learn more about what you can do with MyChart, go to [email or web address].    Your next appointment:   3 month(s)  The format for your next appointment:   In Person  Provider:   Lauro Portal, MD    Other Instructions

## 2024-04-24 NOTE — Assessment & Plan Note (Signed)
 Patient was recently seen by his PCP who noted that he was in A-fib of unclear duration.  He has complained of some lightheadedness and increasing shortness of breath.  His CV score is 2.  I am going to begin him on Eliquis for stroke prevention and for refer him to the A-fib clinic for possible DC cardioversion.

## 2024-04-24 NOTE — Progress Notes (Signed)
 04/24/2024 [name] [name]   01/15/1956  [id number]  Primary Physician Wyn Heater, MD Primary Cardiologist: Avanell Leigh MD Bennye Bravo, [name]  HPI:  [name] [name] is a 68 y.o.  severely overweight remarried Caucasian male father of one daughter, grandfather of 2 grandchildren referred by Benton Brazen [facility] for evaluation of chest pain.  I last saw him in the office 06/23/2016.  His risk factors include treated hypertension and hyperlipidemia. He does have sleep apnea on sleep. 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 He noticed exertional chest pain last several months with associated dyspnea.  I did do a 2D echocardiogram on him which was essentially normal with grade 1 diastolic dysfunction.  A Myoview  stress test was low risk and nonischemic.  He was recently seen by his PCP and found to be in newly recognized A-fib.  He has complained of some increasing shortness of breath and dizziness.  His CAD risk is 2 suggesting he would benefit from initiation of a DOAC.   Current Meds  Medication Sig   aspirin 81 MG tablet Take 81 mg by mouth daily.   cephALEXin (KEFLEX) 500 MG capsule Take 500 mg by mouth 2 (two) times daily.   Cholecalciferol (VITAMIN D3) 5000 units TABS Take 1 tablet by mouth daily.   Coenzyme Q10 (CO Q 10) 10 MG CAPS Take 1 capsule by mouth daily.   diltiazem (TIAZAC) 180 MG 24 hr capsule Take 180 mg by mouth daily.   levothyroxine (SYNTHROID) 150 MCG tablet Take 150 mcg by mouth daily.   magnesium gluconate (MAGONATE) 500 MG tablet Take 500 mg by mouth daily.   nortriptyline (PAMELOR) 10 MG capsule Take 10 mg by mouth 2 (two) times daily.   Current Facility-Administered Medications for the 04/24/24 encounter (Office Visit) with Avanell Leigh, MD  Medication   0.9 %  sodium chloride  infusion     Allergies  Allergen Reactions   Other Other (See Comments)    Fluoroquinolones   Doxycycline  Dermatitis and Other (See Comments)    REACTION: hives  REACTION: hives, REACTION: hives, REACTION: hives, REACTION: hives, REACTION: hives, , REACTION: hives, , , REACTION: hives   Ciprofloxacin      Neuropathy, tendon damage   Niacin Other (See Comments)   Statins     Social History   Socioeconomic History   Marital status: Married    Spouse name: Not on file   Number of children: 1   Years of education: college   Highest education level: Not on file  Occupational History    Employer: [employer]  Tobacco Use   Smoking status: Never   Smokeless tobacco: Never  Vaping Use   Vaping status: Never Used  Substance and Sexual Activity   Alcohol use: Yes    Alcohol/week: 4.0 standard drinks of alcohol    Types: 4 Glasses of wine per week    Comment: occ    Drug use: No   Sexual activity: Not on file  Other Topics Concern   Not on file  Social History Narrative   Occ caffeine .   Patient lives at home alone. Patient is widowed. Patient works full time Naval architect.   Right handed.         Social Drivers of Corporate investment banker Strain: Low Risk  (02/03/2024)   Received from [facility]   Overall Financial Resource  Strain (CARDIA)    Difficulty of Paying Living Expenses: Not hard at all  Food Insecurity: No Food Insecurity (02/03/2024)   Received from [facility]   Hunger Vital Sign    Worried About Running Out of Food in the Last Year: Never true    Ran Out of Food in the Last Year: Never true  Transportation Needs: No Transportation Needs (02/03/2024)   Received from [facility] - Transportation    In the past 12 months, has lack of transportation kept you from medical appointments or from getting medications?: No    Lack of Transportation (Non-Medical): No  Physical Activity: Not on file  Stress: Not on file  Social Connections: Unknown (04/19/2022)   Received from [facility],  [facility]   Social Network    Social Network: Not on file  Intimate Partner Violence: Unknown (03/11/2022)   Received from [facility], [facility]   HITS    Physically Hurt: Not on file    Insult or Talk Down To: Not on file    Threaten Physical Harm: Not on file    Scream or Curse: Not on file     Review of Systems: General: negative for chills, fever, night sweats or weight changes.  Cardiovascular: negative for chest pain, dyspnea on exertion, edema, orthopnea, palpitations, paroxysmal nocturnal dyspnea or shortness of breath Dermatological: negative for rash Respiratory: negative for cough or wheezing Urologic: negative for hematuria Abdominal: negative for nausea, vomiting, diarrhea, bright red blood per rectum, melena, or hematemesis Neurologic: negative for visual changes, syncope, or dizziness All other systems reviewed and are otherwise negative except as noted above.    Blood pressure 137/89, pulse 73, height 5\' 10"  (1.778 m), weight 298 lb (135.2 kg), SpO2 94%.  General appearance: alert and no distress Neck: no adenopathy, no carotid bruit, no JVD, supple, symmetrical, trachea midline, and thyroid  not enlarged, symmetric, no tenderness/mass/nodules Lungs: clear to auscultation bilaterally Heart: irregularly irregular rhythm Extremities: extremities normal, atraumatic, no cyanosis or edema Pulses: 2+ and symmetric Skin: Skin color, texture, turgor normal. No rashes or lesions Neurologic: Grossly normal  EKG EKG Interpretation Date/Time:  Tuesday Apr 24 2024 09:16:36 EDT Ventricular Rate:  73 PR Interval:    QRS Duration:  162 QT Interval:  448 QTC Calculation: 493 R Axis:   102  Text Interpretation: Atrial fibrillation Right bundle branch block When compared with ECG of 04-Apr-2019 13:19, Atrial fibrillation has replaced Sinus rhythm Right bundle branch block has replaced Incomplete right bundle branch block Confirmed by Lauro Portal [phone]) on  04/24/2024 9:47:06 AM    ASSESSMENT AND PLAN:   Obesity BMI 42.  Dyspnea Increasing shortness of breath recently.  He has seen Dr. Waymond Hailey in the past.  He has not smoked.  I am concerned that this may be cardiovascular in nature.  It may be related to his A-fib.  I am going to get a 2D echo and a coronary calcium score to her stratify.  Hypertension History of essential hypertension with blood pressure measured today 137/89.  He is on diltiazem.  Dyslipidemia History of dyslipidemia intolerant to statin therapy with lipid profile performed by his PCP 07/28/2023 revealing total cholesterol 234, LDL 173 and HDL 40.  He would be a candidate for PCSK9.  Sleep apnea On CPAP since 2007.  New onset right bundle branch block (RBBB) Chronic  A-fib (HCC) Patient was recently seen by his PCP who noted that he was in A-fib of  unclear duration.  He has complained of some lightheadedness and increasing shortness of breath.  His CV score is 2.  I am going to begin him on Eliquis for stroke prevention and for refer him to the A-fib clinic for possible DC cardioversion.     Avanell Leigh MD FACP,FACC,FAHA, [facility] 04/24/2024 9:57 AM

## 2024-04-29 MED ORDER — REPATHA SURECLICK 140 MG/ML ~~LOC~~ SOAJ: 140.0000 mg | SUBCUTANEOUS | 3 refills | Status: AC

## 2024-04-29 NOTE — Addendum Note (Signed)
 Addended by: Tracker Mance L on: 04/29/2024 12:07 PM   Modules accepted: Orders

## 2024-05-01 ENCOUNTER — Other Ambulatory Visit (HOSPITAL_COMMUNITY): Payer: Self-pay | Admitting: Family Medicine

## 2024-05-01 LAB — LIPID PANEL
Chol/HDL Ratio: 6.5 ratio — ABNORMAL HIGH (ref 0.0–5.0)
Cholesterol, Total: 226 mg/dL — ABNORMAL HIGH (ref 100–199)
HDL: 35 mg/dL — ABNORMAL LOW (ref >39)
LDL Chol Calc (NIH): 168 mg/dL — ABNORMAL HIGH (ref 0–99)
Triglycerides: 127 mg/dL (ref 0–149)
VLDL Cholesterol Cal: 23 mg/dL (ref 5–40)

## 2024-05-08 ENCOUNTER — Ambulatory Visit (HOSPITAL_BASED_OUTPATIENT_CLINIC_OR_DEPARTMENT_OTHER)
Admission: RE | Admit: 2024-05-08 | Discharge: 2024-05-08 | Disposition: A | Discharge status: Home / Self Care | Payer: Self-pay | Source: Ambulatory Visit | Attending: Cardiovascular Disease | Admitting: Cardiovascular Disease

## 2024-05-08 ENCOUNTER — Telehealth: Payer: Self-pay | Admitting: Cardiovascular Disease

## 2024-05-08 DIAGNOSIS — E785 Hyperlipidemia, unspecified: Secondary | ICD-10-CM | POA: Insufficient documentation

## 2024-05-08 DIAGNOSIS — R0600 Dyspnea, unspecified: Secondary | ICD-10-CM | POA: Insufficient documentation

## 2024-05-08 NOTE — Telephone Encounter (Signed)
 Patient dropped of DOT Form and needs back ASAP. Will leave in provider box.Thank you!

## 2024-05-10 ENCOUNTER — Ambulatory Visit: Payer: Self-pay | Admitting: Cardiovascular Disease

## 2024-05-21 ENCOUNTER — Other Ambulatory Visit (HOSPITAL_BASED_OUTPATIENT_CLINIC_OR_DEPARTMENT_OTHER): Payer: Self-pay

## 2024-05-21 MED ORDER — REPATHA SURECLICK 140 MG/ML ~~LOC~~ SOAJ
140.0000 mg | SUBCUTANEOUS | 3 refills | Status: AC
  Filled 2024-05-21: qty 6, 84d supply, fill #0

## 2024-05-24 NOTE — Telephone Encounter (Signed)
 Late entry: Spoke with Dr. Katheryne Pane on 6/9 regarding pt's DOT forms. Pt will need to complete echocardiogram before Dr. Katheryne Pane can complete DOT forms. Pt made aware of this. Echo is scheduled for 6/26. Will complete forms (if appropriate) after echo is resulted. Pt has no further questions at this time.

## 2024-05-31 ENCOUNTER — Ambulatory Visit (HOSPITAL_COMMUNITY)
Admission: RE | Admit: 2024-05-31 | Discharge: 2024-05-31 | Disposition: A | Discharge status: Home / Self Care | Source: Ambulatory Visit | Attending: Cardiovascular Disease | Admitting: Cardiovascular Disease

## 2024-05-31 DIAGNOSIS — R0600 Dyspnea, unspecified: Secondary | ICD-10-CM

## 2024-05-31 LAB — ECHOCARDIOGRAM COMPLETE: S' Lateral: 3.7 cm

## 2024-05-31 NOTE — Telephone Encounter (Signed)
 Left message for pt that DOT forms are completed and placed at front desk for him to pick up.

## 2024-07-16 ENCOUNTER — Encounter: Payer: Self-pay | Admitting: Cardiovascular Disease

## 2024-07-16 ENCOUNTER — Ambulatory Visit: Attending: Cardiovascular Disease | Admitting: Cardiovascular Disease

## 2024-07-16 VITALS — BP 116/80 | HR 70 | Ht 70.0 in

## 2024-07-16 DIAGNOSIS — I10 Essential (primary) hypertension: Secondary | ICD-10-CM | POA: Diagnosis not present

## 2024-07-16 DIAGNOSIS — G4733 Obstructive sleep apnea (adult) (pediatric): Secondary | ICD-10-CM | POA: Diagnosis not present

## 2024-07-16 DIAGNOSIS — E785 Hyperlipidemia, unspecified: Secondary | ICD-10-CM | POA: Diagnosis not present

## 2024-07-16 DIAGNOSIS — Z6841 Body Mass Index (BMI) 40.0 and over, adult: Secondary | ICD-10-CM

## 2024-07-16 DIAGNOSIS — I4819 Other persistent atrial fibrillation: Secondary | ICD-10-CM | POA: Diagnosis not present

## 2024-07-16 DIAGNOSIS — R931 Abnormal findings on diagnostic imaging of heart and coronary circulation: Secondary | ICD-10-CM | POA: Insufficient documentation

## 2024-07-16 DIAGNOSIS — E66813 Obesity, class 3: Secondary | ICD-10-CM

## 2024-07-16 NOTE — Assessment & Plan Note (Signed)
 Coronary calcium score performed 04/24/2024 was 235 distributed in all 3 coronary arteries.  Based on this, we will optimize his risk factor profile.  He has no other symptoms.

## 2024-07-16 NOTE — Patient Instructions (Signed)
 Medication Instructions:  Your physician recommends that you continue on your current medications as directed. Please refer to the Current Medication list given to you today.  *If you need a refill on your cardiac medications before your next appointment, please call your pharmacy*   Lab Work: Your physician recommends that you have labs drawn today: Lipid/liver panel  If you have labs (blood work) drawn today and your tests are completely normal, you will receive your results only by: MyChart Message (if you have MyChart) OR A paper copy in the mail If you have any lab test that is abnormal or we need to change your treatment, we will call you to review the results.   Follow-Up: At [facility], you and your health needs are our priority.  As part of our continuing mission to provide you with exceptional heart care, our providers are all part of one team.  This team includes your primary Cardiologist (physician) and Advanced Practice Providers or APPs (Physician Assistants and Nurse Practitioners) who all work together to provide you with the care you need, when you need it.  Your next appointment:   6 month(s)  Provider:   Jon Hails, PA-C, Callie Goodrich, PA-C, Kathleen Johnson, PA-C, Hao Meng, PA-C, Damien Braver, NP, or Katlyn West, NP         Then, Dorn Lesches, MD will plan to see you again in 12 month(s).     We recommend signing up for the patient portal called MyChart.  Sign up information is provided on this After Visit Summary.  MyChart is used to connect with patients for Virtual Visits (Telemedicine).  Patients are able to view lab/test results, encounter notes, upcoming appointments, etc.  Non-urgent messages can be sent to your provider as well.   To learn more about what you can do with MyChart, go to [email or web address].

## 2024-07-16 NOTE — Assessment & Plan Note (Signed)
 History of obstructive sleep apnea on CPAP since 2007.

## 2024-07-16 NOTE — Assessment & Plan Note (Signed)
 History of essential hypertension with blood pressure measured today at 116/80.  He is on diltiazem.

## 2024-07-16 NOTE — Assessment & Plan Note (Signed)
 History of morbid obesity with a BMI of over 40.

## 2024-07-16 NOTE — Assessment & Plan Note (Signed)
 History of dyslipidemia recently started on Repatha  with a lipid profile performed 05/01/2024 revealing a total cholesterol 226, LDL 168 and HDL 35.  Given his elevated coronary calcium score, his LDL goal is less than 70.  We will recheck a lipid profile today.

## 2024-07-16 NOTE — Progress Notes (Signed)
 07/16/2024 [name] [name]   1956/10/21  [id number]  Primary Physician [name] Senior, MD Primary Cardiologist: [name] JINNY Lesches MD [name] [name], [name]  HPI:  [name] [name] is a 68 y.o.  severely overweight remarried Caucasian male father of one daughter, grandfather of 2 grandchildren referred by [name] [name] [facility] for evaluation of chest pain.  I last saw him in the office 04/24/2024.  His risk factors include treated hypertension and hyperlipidemia. He does have sleep apnea on CPAP since 2007. 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 He noticed exertional chest pain last several months with associated dyspnea.  I did do a 2D echocardiogram on him which was essentially normal with grade 1 diastolic dysfunction.  A Myoview  stress test was low risk and nonischemic.   He was seen by his PCP and found to be in newly recognized A-fib.  He has complained of some increasing shortness of breath and dizziness.  His CV risk score is 2 suggesting he would benefit from initiation of a DOAC.  Since I saw him several months ago I did get a coronary calcium score 04/24/2024 which was 235 distributed in all 3 coronary arteries.  A 2D echo revealed EF of 45 to 50% with mild global hypokinesia and no valvular abnormalities.  He is completely asymptomatic.   Current Meds  Medication Sig   apixaban  (ELIQUIS ) 5 MG TABS tablet Take 1 tablet (5 mg total) by mouth 2 (two) times daily.   aspirin 81 MG tablet Take 81 mg by mouth daily.   Cholecalciferol (VITAMIN D3) 5000 units TABS Take 1 tablet by mouth daily.   Coenzyme Q10 (Q-SORB CO Q-10) 100 MG capsule Take 100 mg by mouth daily.   diltiazem (CARDIZEM CD) 240 MG 24 hr capsule Take 240 mg by mouth daily.   Evolocumab  (REPATHA  SURECLICK) 140 MG/ML SOAJ Inject 140 mg into the skin every 14 (fourteen) days.   levothyroxine (SYNTHROID) 150 MCG tablet Take 150 mcg by mouth daily.   magnesium  gluconate (MAGONATE) 500 MG tablet Take 500 mg by mouth daily.   nortriptyline (PAMELOR) 10 MG capsule Take 10 mg by mouth 2 (two) times daily.   Potassium 99 MG TABS Take 99 mg by mouth daily at 6 (six) AM.   Current Facility-Administered Medications for the 07/16/24 encounter (Office Visit) with [name] [name] JINNY, MD  Medication   0.9 %  sodium chloride  infusion     Allergies  Allergen Reactions   Other Other (See Comments)    Fluoroquinolones   Doxycycline Dermatitis and Other (See Comments)    REACTION: hives  REACTION: hives, REACTION: hives, REACTION: hives, REACTION: hives, REACTION: hives, , REACTION: hives, , , REACTION: hives   Ciprofloxacin      Neuropathy, tendon damage   Ciprofloxacin  Hcl     Other Reaction(s): joint pain   Moxifloxacin     Other Reaction(s): joint pain   Niacin Other (See Comments)    Other Reaction(s): rash, flushing   Statins     Other Reaction(s): Myopathy (tried atorva, rosuva, prava per pt report)   Doxycycline Hyclate Rash    Social History   Socioeconomic History   Marital status: Married    Spouse name: Not on file   Number of children: 1   Years of education: college   Highest education level: Not on file  Occupational History    Employer: [employer]  Tobacco Use  Smoking status: Never   Smokeless tobacco: Never  Vaping Use   Vaping status: Never Used  Substance and Sexual Activity   Alcohol use: Yes    Alcohol/week: 4.0 standard drinks of alcohol    Types: 4 Glasses of wine per week    Comment: occ    Drug use: No   Sexual activity: Not on file  Other Topics Concern   Not on file  Social History Narrative   Occ caffeine .   Patient lives at home alone. Patient is widowed. Patient works full time Naval architect.   Right handed.         Social Drivers of Health   Financial Resource Strain: Patient Declined (06/05/2024)   Received from [facility]   Overall Financial Resource Strain  (CARDIA)    Difficulty of Paying Living Expenses: Patient declined  Food Insecurity: Patient Declined (06/05/2024)   Received from [facility]   Hunger Vital Sign    Within the past 12 months, you worried that your food would run out before you got the money to buy more.: Patient declined    Within the past 12 months, the food you bought just didn't last and you didn't have money to get more.: Patient declined  Transportation Needs: Patient Declined (06/05/2024)   Received from [facility] - Transportation    In the past 12 months, has lack of transportation kept you from medical appointments or from getting medications?: Patient declined    Lack of Transportation (Non-Medical): Patient declined  Physical Activity: Not on file  Stress: Not on file  Social Connections: Unknown (04/19/2022)   Received from [facility]   Social Network    Social Network: Not on file  Intimate Partner Violence: Unknown (03/11/2022)   Received from [facility]   HITS    Physically Hurt: Not on file    Insult or Talk Down To: Not on file    Threaten Physical Harm: Not on file    Scream or Curse: Not on file     Review of Systems: General: negative for chills, fever, night sweats or weight changes.  Cardiovascular: negative for chest pain, dyspnea on exertion, edema, orthopnea, palpitations, paroxysmal nocturnal dyspnea or shortness of breath Dermatological: negative for rash Respiratory: negative for cough or wheezing Urologic: negative for hematuria Abdominal: negative for nausea, vomiting, diarrhea, bright red blood per rectum, melena, or hematemesis Neurologic: negative for visual changes, syncope, or dizziness All other systems reviewed and are otherwise negative except as noted above.    Blood pressure 116/80, pulse 70, height 5' 10 (1.778 m), SpO2 94%.  General appearance: alert and no distress Neck: no adenopathy, no carotid bruit, no JVD, supple,  symmetrical, trachea midline, and thyroid  not enlarged, symmetric, no tenderness/mass/nodules Lungs: clear to auscultation bilaterally Heart: irregularly irregular rhythm Extremities: extremities normal, atraumatic, no cyanosis or edema Pulses: 2+ and symmetric Skin: Skin color, texture, turgor normal. No rashes or lesions Neurologic: Grossly normal  EKG not performed today      ASSESSMENT AND PLAN:   Obesity History of morbid obesity with a BMI of over 40.  Hypertension History of essential hypertension with blood pressure measured today at 116/80.  He is on diltiazem.  Dyslipidemia History of dyslipidemia recently started on Repatha  with a lipid profile performed 05/01/2024 revealing a total cholesterol 226, LDL 168 and HDL 35.  Given his elevated coronary calcium score, his LDL goal is less than 70.  We  will recheck a lipid profile today.  Sleep apnea History of obstructive sleep apnea on CPAP since 2007.  A-fib (HCC) History of persistent A-fib with CV score of 2.  He was started on Eliquis  when I last saw him.  He is asymptomatic from this.  Elevated coronary artery calcium score Coronary calcium score performed 04/24/2024 was 235 distributed in all 3 coronary arteries.  Based on this, we will optimize his risk factor profile.  He has no other symptoms.     [name] DOROTHA Lesches MD FACP,FACC,FAHA, [facility] 07/16/2024 9:20 AM

## 2024-07-16 NOTE — Assessment & Plan Note (Signed)
 History of persistent A-fib with CV score of 2.  He was started on Eliquis  when I last saw him.  He is asymptomatic from this.

## 2024-07-17 ENCOUNTER — Ambulatory Visit: Payer: Self-pay | Admitting: Cardiovascular Disease

## 2024-07-17 DIAGNOSIS — E785 Hyperlipidemia, unspecified: Secondary | ICD-10-CM

## 2024-07-17 DIAGNOSIS — R931 Abnormal findings on diagnostic imaging of heart and coronary circulation: Secondary | ICD-10-CM

## 2024-07-17 LAB — LIPID PANEL
Chol/HDL Ratio: 3.8 ratio (ref 0.0–5.0)
Cholesterol, Total: 170 mg/dL (ref 100–199)
HDL: 45 mg/dL (ref >39)
LDL Chol Calc (NIH): 103 mg/dL — ABNORMAL HIGH (ref 0–99)
Triglycerides: 125 mg/dL (ref 0–149)
VLDL Cholesterol Cal: 22 mg/dL (ref 5–40)

## 2024-07-17 LAB — HEPATIC FUNCTION PANEL
ALT: 38 IU/L (ref 0–44)
AST: 32 IU/L (ref 0–40)
Albumin: 4.3 g/dL (ref 3.9–4.9)
Alkaline Phosphatase: 87 IU/L (ref 44–121)
Bilirubin Total: 0.6 mg/dL (ref 0.0–1.2)
Bilirubin, Direct: 0.26 mg/dL (ref 0.00–0.40)
Total Protein: 7.1 g/dL (ref 6.0–8.5)

## 2024-07-25 ENCOUNTER — Encounter: Payer: Self-pay | Admitting: Cardiovascular Disease

## 2024-07-25 MED ORDER — EZETIMIBE 10 MG PO TABS
10.0000 mg | ORAL_TABLET | Freq: Every day | ORAL | 3 refills | Status: AC
Start: 2024-07-25 — End: ?

## 2024-07-31 ENCOUNTER — Ambulatory Visit: Admitting: Cardiovascular Disease

## 2024-09-12 DIAGNOSIS — Z96652 Presence of left artificial knee joint: Secondary | ICD-10-CM | POA: Diagnosis not present

## 2024-09-12 DIAGNOSIS — M76892 Other specified enthesopathies of left lower limb, excluding foot: Secondary | ICD-10-CM | POA: Diagnosis not present

## 2024-10-17 ENCOUNTER — Other Ambulatory Visit: Payer: Self-pay | Admitting: Cardiovascular Disease

## 2024-10-17 NOTE — Telephone Encounter (Signed)
 Prescription refill request for Eliquis  received. Indication:afib Last office visit:8/25 Scr:1.22  6/25 Age: 68 Weight:135.2  kg  Prescription refilled
